# Patient Record
Sex: Female | Born: 1945 | Race: White | Hispanic: No | Marital: Married | Smoking: Former smoker
Health system: Southern US, Community
[De-identification: ages and names within clinical notes are randomized; demographics above are authoritative.]

## PROBLEM LIST (undated history)

## (undated) DIAGNOSIS — E785 Hyperlipidemia, unspecified: Secondary | ICD-10-CM

## (undated) DIAGNOSIS — M4800 Spinal stenosis, site unspecified: Secondary | ICD-10-CM

## (undated) DIAGNOSIS — M797 Fibromyalgia: Secondary | ICD-10-CM

## (undated) DIAGNOSIS — M810 Age-related osteoporosis without current pathological fracture: Secondary | ICD-10-CM

## (undated) DIAGNOSIS — H40813 Glaucoma with increased episcleral venous pressure, bilateral: Secondary | ICD-10-CM

## (undated) HISTORY — PX: CATARACT EXTRACTION: SUR2

## (undated) HISTORY — PX: CARPAL TUNNEL RELEASE: SHX101

## (undated) HISTORY — DX: Glaucoma with increased episcleral venous pressure, bilateral: H40.813

## (undated) HISTORY — DX: Fibromyalgia: M79.7

## (undated) HISTORY — PX: RHINOPLASTY: SUR1284

## (undated) HISTORY — PX: TUBAL LIGATION: SHX77

## (undated) HISTORY — PX: COLONOSCOPY: SHX174

## (undated) HISTORY — DX: Age-related osteoporosis without current pathological fracture: M81.0

## (undated) HISTORY — DX: Hyperlipidemia, unspecified: E78.5

---

## 2001-06-09 ENCOUNTER — Other Ambulatory Visit: Admission: RE | Admit: 2001-06-09 | Discharge: 2001-06-09 | Payer: Self-pay

## 2001-12-15 ENCOUNTER — Ambulatory Visit (HOSPITAL_BASED_OUTPATIENT_CLINIC_OR_DEPARTMENT_OTHER): Admission: RE | Admit: 2001-12-15 | Discharge: 2001-12-15 | Payer: Self-pay | Admitting: Orthopedic Surgery

## 2006-04-11 ENCOUNTER — Ambulatory Visit: Payer: Self-pay | Admitting: Internal Medicine

## 2006-04-23 ENCOUNTER — Ambulatory Visit: Payer: Self-pay | Admitting: Internal Medicine

## 2010-09-04 ENCOUNTER — Encounter (INDEPENDENT_AMBULATORY_CARE_PROVIDER_SITE_OTHER): Payer: Medicare Other

## 2010-09-04 DIAGNOSIS — M79609 Pain in unspecified limb: Secondary | ICD-10-CM

## 2010-09-07 NOTE — Procedures (Unsigned)
DUPLEX DEEP VENOUS EXAM - LOWER EXTREMITY  INDICATION:  Pain and swelling.  HISTORY:  Edema:  Yes. Trauma/Surgery:  No. Pain:  Yes. PE:  No. Previous DVT:  No. Anticoagulants:  No. Other:  DUPLEX EXAM:               CFV   SFV   PopV  PTV    GSV               R  L  R  L  R  L  R   L  R  L Thrombosis    o  o  o     o     o      o Spontaneous   +  +  +     +     +      + Phasic        +  +  +     +     +      + Augmentation  +  +  +     +     +      + Compressible  +  +  +     +     +      + Competent     +  +  +     +     +      +  Legend:  + - yes  o - no  p - partial  D - decreased  IMPRESSION:  No evidence of acute deep vein thrombosis or superficial thrombophlebitis within the right lower extremity or venous insufficiency.  Called and spoke with Drema Halon at 10:01.   _____________________________ Janetta Hora. Fields, MD  OD/MEDQ  D:  09/04/2010  T:  09/04/2010  Job:  [id number]

## 2011-04-03 ENCOUNTER — Encounter: Payer: Self-pay | Admitting: Internal Medicine

## 2012-01-02 ENCOUNTER — Encounter: Payer: Self-pay | Admitting: Internal Medicine

## 2012-04-07 DIAGNOSIS — H11829 Conjunctivochalasis, unspecified eye: Secondary | ICD-10-CM | POA: Insufficient documentation

## 2012-04-07 DIAGNOSIS — H43399 Other vitreous opacities, unspecified eye: Secondary | ICD-10-CM | POA: Insufficient documentation

## 2012-04-07 DIAGNOSIS — H35379 Puckering of macula, unspecified eye: Secondary | ICD-10-CM | POA: Insufficient documentation

## 2012-04-07 DIAGNOSIS — H52209 Unspecified astigmatism, unspecified eye: Secondary | ICD-10-CM | POA: Insufficient documentation

## 2012-04-07 DIAGNOSIS — H02889 Meibomian gland dysfunction of unspecified eye, unspecified eyelid: Secondary | ICD-10-CM | POA: Insufficient documentation

## 2012-04-07 DIAGNOSIS — H5210 Myopia, unspecified eye: Secondary | ICD-10-CM | POA: Insufficient documentation

## 2012-04-07 DIAGNOSIS — H02839 Dermatochalasis of unspecified eye, unspecified eyelid: Secondary | ICD-10-CM | POA: Insufficient documentation

## 2012-04-07 DIAGNOSIS — H04123 Dry eye syndrome of bilateral lacrimal glands: Secondary | ICD-10-CM | POA: Insufficient documentation

## 2012-04-07 DIAGNOSIS — H40059 Ocular hypertension, unspecified eye: Secondary | ICD-10-CM | POA: Insufficient documentation

## 2012-04-07 DIAGNOSIS — H43819 Vitreous degeneration, unspecified eye: Secondary | ICD-10-CM | POA: Insufficient documentation

## 2012-06-24 ENCOUNTER — Other Ambulatory Visit: Payer: Self-pay | Admitting: Dermatology

## 2013-02-18 ENCOUNTER — Other Ambulatory Visit: Payer: Self-pay | Admitting: Unknown Physician Specialty

## 2013-02-18 DIAGNOSIS — E2839 Other primary ovarian failure: Secondary | ICD-10-CM

## 2013-03-26 ENCOUNTER — Other Ambulatory Visit: Payer: Medicare Other

## 2013-08-02 DIAGNOSIS — H40003 Preglaucoma, unspecified, bilateral: Secondary | ICD-10-CM | POA: Insufficient documentation

## 2014-02-01 ENCOUNTER — Encounter: Payer: Self-pay | Admitting: Internal Medicine

## 2014-03-23 ENCOUNTER — Encounter: Payer: Self-pay | Admitting: Internal Medicine

## 2014-04-12 ENCOUNTER — Encounter: Payer: Medicare Other | Admitting: Internal Medicine

## 2014-05-04 ENCOUNTER — Ambulatory Visit (AMBULATORY_SURGERY_CENTER): Payer: Self-pay

## 2014-05-04 VITALS — Ht 67.0 in | Wt 161.0 lb

## 2014-05-04 DIAGNOSIS — Z860100 Personal history of colon polyps, unspecified: Secondary | ICD-10-CM

## 2014-05-04 MED ORDER — MOVIPREP 100 G PO SOLR: 1.0000 | Freq: Once | ORAL | Status: DC

## 2014-05-04 NOTE — Progress Notes (Signed)
No allergies to eggs or soy No home oxygen No diet/weight loss meds No past problems with anesthesia  Has email  Emmi instructions given for colonoscopy 

## 2014-05-18 ENCOUNTER — Encounter: Payer: Self-pay | Admitting: Internal Medicine

## 2014-05-18 ENCOUNTER — Ambulatory Visit (AMBULATORY_SURGERY_CENTER): Payer: Medicare Other | Admitting: Internal Medicine

## 2014-05-18 VITALS — BP 136/92 | HR 79 | Temp 98.3°F | Resp 21 | Ht 67.0 in | Wt 161.0 lb

## 2014-05-18 DIAGNOSIS — R194 Change in bowel habit: Secondary | ICD-10-CM

## 2014-05-18 DIAGNOSIS — K599 Functional intestinal disorder, unspecified: Secondary | ICD-10-CM | POA: Diagnosis not present

## 2014-05-18 DIAGNOSIS — R197 Diarrhea, unspecified: Secondary | ICD-10-CM

## 2014-05-18 DIAGNOSIS — Z86010 Personal history of colonic polyps: Secondary | ICD-10-CM

## 2014-05-18 MED ORDER — SODIUM CHLORIDE 0.9 % IV SOLN: 500.0000 mL | INTRAVENOUS | Status: DC

## 2014-05-18 NOTE — Progress Notes (Signed)
Report to PACU, RN, vss, BBS= Clear.  

## 2014-05-18 NOTE — Progress Notes (Signed)
Called to room to assist during endoscopic procedure.  Patient ID and intended procedure confirmed with present staff. Received instructions for my participation in the procedure from the performing physician.  

## 2014-05-18 NOTE — Op Note (Signed)
[facility] [address] [place], [id number]   COLONOSCOPY PROCEDURE REPORT  PATIENT: Autumn ScarletRead, [name]  MR#: [id number] BIRTHDATE: 05-Laakso-1947 , 69  yrs. old GENDER: female ENDOSCOPIST: Roxy CedarJohn N Perry Jr, MD REFERRED BY:W.  Buren Kosouglas Shaw, M.D. PROCEDURE DATE:  05/18/2014 PROCEDURE:   Colonoscopy with biopsy First Screening Colonoscopy - Avg.  risk and is 50 yrs.  old or older - No.  Prior Negative Screening - Now for repeat screening. N/A  History of Adenoma - Now for follow-up colonoscopy & has been > or = to 3 yrs.  N/A  Polyps Removed Today? No.  Polyps Removed Today? No.  Recommend repeat exam, <10 yrs? No. ASA CLASS:   Class II INDICATIONS:change in bowel habits (loose stools) and follow up of colonic polyps(large right-sided hyperplastic polyp February 2008).  MEDICATIONS: Monitored anesthesia care and Propofol 230 mg IV  DESCRIPTION OF PROCEDURE:   After the risks benefits and alternatives of the procedure were thoroughly explained, informed consent was obtained.  The digital rectal exam revealed no abnormalities of the rectum.   The LB [id number]  endoscope was introduced through the anus and advanced to the cecum, which was identified by both the appendix and ileocecal valve. No adverse events experienced.   The quality of the prep was excellent, using MoviPrep  The instrument was then slowly withdrawn as the colon was fully examined.    COLON FINDINGS: There was mild diverticulosis noted in the sigmoid colon.   The examination was otherwise normal. Random colon biopsies taken.  Retroflexed views revealed no abnormalities. The time to cecum=3 minutes 39 seconds.  Withdrawal time=10 minutes 32 seconds.  The scope was withdrawn and the procedure completed. COMPLICATIONS: There were no immediate complications.  ENDOSCOPIC IMPRESSION: 1.   Mild diverticulosis was noted in the sigmoid colon 2.   The examination was otherwise normal 3.  Change in bowel  habits. Random colon biopsies taken  RECOMMENDATIONS: 1.  Await biopsy results. Dr. Marina GoodellPerry will send you a letter in a few weeks. 2.  Return to the care of your primary provider.  GI follow up as needed  eSigned:  Roxy CedarJohn N Perry Jr, MD 05/18/2014 10:02 AM   cc: Kari BaarsW.  Douglas Shaw, MD and The Patient

## 2014-05-18 NOTE — Patient Instructions (Signed)
YOU HAD AN ENDOSCOPIC PROCEDURE TODAY AT THE [facility]:   Refer to the procedure report that was given to you for any specific questions about what was found during the examination.  If the procedure report does not answer your questions, please call your gastroenterologist to clarify.  If you requested that your care partner not be given the details of your procedure findings, then the procedure report has been included in a sealed envelope for you to review at your convenience later.  YOU SHOULD EXPECT: Some feelings of bloating in the abdomen. Passage of more gas than usual.  Walking can help get rid of the air that was put into your GI tract during the procedure and reduce the bloating. If you had a lower endoscopy (such as a colonoscopy or flexible sigmoidoscopy) you Doxtater notice spotting of blood in your stool or on the toilet paper. If you underwent a bowel prep for your procedure, you Harkins not have a normal bowel movement for a few days.  Please Note:  You might notice some irritation and congestion in your nose or some drainage.  This is from the oxygen used during your procedure.  There is no need for concern and it should clear up in a day or so.  SYMPTOMS TO REPORT IMMEDIATELY:   Following lower endoscopy (colonoscopy or flexible sigmoidoscopy):  Excessive amounts of blood in the stool  Significant tenderness or worsening of abdominal pains  Swelling of the abdomen that is new, acute  Fever of 100F or higher   A gastroenterologist can be reached at any hour by calling [phone].   DIET: Your first meal following the procedure should be a small meal and then it is ok to progress to your normal diet. Heavy or fried foods are harder to digest and [place] make you feel nauseous or bloated.  Likewise, meals heavy in dairy and vegetables can increase bloating.  Drink plenty of fluids but you should avoid alcoholic beverages for 24 hours.  ACTIVITY:  You should plan to take it  easy for the rest of today and you should NOT DRIVE or use heavy machinery until tomorrow (because of the sedation medicines used during the test).    FOLLOW UP: Our staff will call the number listed on your records the next business day following your procedure to check on you and address any questions or concerns that you Pless have regarding the information given to you following your procedure. If we do not reach you, we will leave a message.  However, if you are feeling well and you are not experiencing any problems, there is no need to return our call.  We will assume that you have returned to your regular daily activities without incident.  If any biopsies were taken you will be contacted by phone or by letter within the next 1-3 weeks.  Please call us at [phone] if you have not heard about the biopsies in 3 weeks.    SIGNATURES/CONFIDENTIALITY: You and/or your care partner have signed paperwork which will be entered into your electronic medical record.  These signatures attest to the fact that that the information above on your After Visit Summary has been reviewed and is understood.  Full responsibility of the confidentiality of this discharge information lies with you and/or your care-partner.  Recommendations Discharge instructions given to patient and/or care partner. Diverticulosis and high fiber diet handouts provided. Biopsy results pending.

## 2014-05-19 ENCOUNTER — Telehealth: Payer: Self-pay | Admitting: *Deleted

## 2014-05-19 NOTE — Telephone Encounter (Signed)
Left message that we called for f/u 

## 2014-05-24 ENCOUNTER — Encounter: Payer: Self-pay | Admitting: Internal Medicine

## 2014-07-26 DIAGNOSIS — H26492 Other secondary cataract, left eye: Secondary | ICD-10-CM | POA: Insufficient documentation

## 2019-05-09 ENCOUNTER — Ambulatory Visit: Payer: Medicare Other | Attending: Internal Medicine

## 2019-05-09 DIAGNOSIS — Z23 Encounter for immunization: Secondary | ICD-10-CM

## 2019-05-09 NOTE — Progress Notes (Signed)
   Covid-19 Vaccination Clinic  Name:  Autumn Paul    MRN: [id number] DOB: 1945-12-01  05/09/2019  [name] was observed post Covid-19 immunization for 15 minutes without incidence. She was provided with Vaccine Information Sheet and instruction to access the V-Safe system.   [name] was instructed to call 911 with any severe reactions post vaccine: Marland Kitchen Difficulty breathing  . Swelling of your face and throat  . A fast heartbeat  . A bad rash all over your body  . Dizziness and weakness    Immunizations Administered    Name Date Dose VIS Date Route   Pfizer COVID-19 Vaccine 05/09/2019  9:14 AM 0.3 mL 02/26/2019 Intramuscular   Manufacturer: [employer], [employer]   Lot: [id number]   NDC: [address]-5

## 2019-06-02 ENCOUNTER — Ambulatory Visit: Payer: Medicare Other | Attending: Internal Medicine

## 2019-06-02 DIAGNOSIS — Z23 Encounter for immunization: Secondary | ICD-10-CM

## 2019-06-02 NOTE — Progress Notes (Signed)
   Covid-19 Vaccination Clinic  Name:  Autumn Paul    MRN: [id number] DOB: 1945/08/28  06/02/2019  Autumn Paul was observed post Covid-19 immunization for 15 minutes without incident. She was provided with Vaccine Information Sheet and instruction to access the V-Safe system.   Autumn Paul was instructed to call 911 with any severe reactions post vaccine: Marland Kitchen Difficulty breathing  . Swelling of face and throat  . A fast heartbeat  . A bad rash all over body  . Dizziness and weakness   Immunizations Administered    Name Date Dose VIS Date Route   Pfizer COVID-19 Vaccine 06/02/2019  8:29 AM 0.3 mL 02/26/2019 Intramuscular   Manufacturer: [employer], [employer]   Lot: [id number]   NDC: [address]-7

## 2019-07-06 ENCOUNTER — Other Ambulatory Visit: Payer: Self-pay | Admitting: Orthopedic Surgery

## 2019-07-06 DIAGNOSIS — M545 Low back pain, unspecified: Secondary | ICD-10-CM

## 2019-08-10 ENCOUNTER — Ambulatory Visit
Admission: RE | Admit: 2019-08-10 | Discharge: 2019-08-10 | Disposition: A | Discharge status: Home / Self Care | Payer: Medicare Other | Source: Ambulatory Visit | Attending: Orthopedic Surgery | Admitting: Orthopedic Surgery

## 2019-08-10 DIAGNOSIS — M545 Low back pain, unspecified: Secondary | ICD-10-CM

## 2019-10-19 ENCOUNTER — Ambulatory Visit (INDEPENDENT_AMBULATORY_CARE_PROVIDER_SITE_OTHER): Payer: Medicare Other

## 2019-10-19 ENCOUNTER — Ambulatory Visit (INDEPENDENT_AMBULATORY_CARE_PROVIDER_SITE_OTHER): Payer: Medicare Other | Admitting: Podiatry

## 2019-10-19 ENCOUNTER — Encounter: Payer: Self-pay | Admitting: Podiatry

## 2019-10-19 ENCOUNTER — Other Ambulatory Visit: Payer: Self-pay

## 2019-10-19 DIAGNOSIS — M722 Plantar fascial fibromatosis: Secondary | ICD-10-CM

## 2019-10-19 MED ORDER — METHYLPREDNISOLONE 4 MG PO TBPK
ORAL_TABLET | ORAL | 0 refills | Status: DC
Start: 2019-10-19 — End: 2019-10-19

## 2019-10-19 MED ORDER — METHYLPREDNISOLONE 4 MG PO TBPK
ORAL_TABLET | ORAL | 0 refills | Status: DC
Start: 2019-10-19 — End: 2020-12-20

## 2019-10-19 NOTE — Progress Notes (Signed)
Subjective:  Patient ID: Autumn Paul, female    DOB: 10-28-1945,  MRN: [id number] HPI Chief Complaint  Patient presents with  . Foot Pain    Plantar heel left - aching x 6 weeks, AM pain, on celebrex for arthritis  . Foot Pain    1st MPJ right - injury of jamming toe years ago, developed arthritis, doesn't want surgery  . New Patient (Initial Visit)    74 y.o. female presents with the above complaint.   ROS: Denies fever chills nausea vomiting muscle aches pains calf pain back pain chest pain shortness of breath.  Past Medical History:  Diagnosis Date  . 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 Hyperlipidemia    Past Surgical History:  Procedure Laterality Date  . CARPAL TUNNEL RELEASE     right wrist  . CATARACT EXTRACTION    . COLONOSCOPY    . RHINOPLASTY    . TUBAL LIGATION      Current Outpatient Medications:  .  Ascorbic Acid (VITAMIN C PO), Take by mouth., Disp: , Rfl:  .  celecoxib (CELEBREX) 200 MG capsule, Take 200 mg by mouth 2 (two) times daily., Disp: , Rfl:  .  diclofenac Sodium (VOLTAREN) 1 % GEL, Apply topically., Disp: , Rfl:  .  dorzolamide-timolol (COSOPT) 22.3-6.8 MG/ML ophthalmic solution, 1 drop 2 (two) times daily., Disp: , Rfl:  .  Ergocalciferol (VITAMIN D2 PO), Take 1.25 mg by mouth once a week., Disp: , Rfl:  .  esomeprazole (NEXIUM) 20 MG capsule, Take 20 mg by mouth daily at 12 noon., Disp: , Rfl:  .  ezetimibe (ZETIA) 10 MG tablet, Take 10 mg by mouth daily., Disp: , Rfl:  .  FLUTICASONE PROPIONATE, NASAL, NA, Place into the nose at bedtime., Disp: , Rfl:  .  latanoprost (XALATAN) 0.005 % ophthalmic solution, 1 drop at bedtime., Disp: , Rfl:  .  methylPREDNISolone (MEDROL DOSEPAK) 4 MG TBPK tablet, 6 day dose pack - take as directed, Disp: 21 tablet, Rfl: 0 .  rosuvastatin (CRESTOR) 10 MG tablet, Take 10 mg by mouth at bedtime., Disp: , Rfl:  .  vitamin B-12 (CYANOCOBALAMIN) 500 MCG tablet, Take  500 mcg by mouth daily., Disp: , Rfl:   Allergies  Allergen Reactions  . Aleve [Naproxen Sodium] Itching    Pupils dilate  . Sulfa Antibiotics     rash   Review of Systems Objective:  There were no vitals filed for this visit.  General: Well developed, nourished, in no acute distress, alert and oriented x3   Dermatological: Skin is warm, dry and supple bilateral. Nails x 10 are well maintained; remaining integument appears unremarkable at this time. There are no open sores, no preulcerative lesions, no rash or signs of infection present.  Vascular: Dorsalis Pedis artery and Posterior Tibial artery pedal pulses are 2/4 bilateral with immedate capillary fill time. Pedal hair growth present. No varicosities and no lower extremity edema present bilateral.   Neruologic: Grossly intact via light touch bilateral. Vibratory intact via tuning fork bilateral. Protective threshold with Semmes Wienstein monofilament intact to all pedal sites bilateral. Patellar and Achilles deep tendon reflexes 2+ bilateral. No Babinski or clonus noted bilateral.   Musculoskeletal: No gross boney pedal deformities bilateral. No pain, crepitus, or limitation noted with foot and ankle range of motion bilateral. Muscular strength 5/5 in all groups tested bilateral.  Pain on palpation medial calcaneal tubercle of the left heel.  Right foot  demonstrates limited range of motion of the first metatarsophalangeal joint right.  No crepitation noted.  Gait: Unassisted, Nonantalgic.    Radiographs:  Radiographs taken today of the left heel demonstrate soft tissue increase in density plantar fascial kidney insertion site no fractures are identified.  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  Plan fasciitis left foot.  Plan: At this point I started her on methylprednisolone she will discontinue Celebrex during that time and then restarted once done with the prednisone.  I  also injected her left heel today 20 mg Kenalog 5 mg Marcaine point maximal tenderness.  Tolerated procedure well without complications she understands that she is to notify me with any concerns.  Also placed her in a plantar fascial brace discussed appropriate shoe gear stretching exercises ice therapy and shoe gear modification.  We will follow-up with her in 1 month     Lyman Balingit T. [place], [place]

## 2019-10-19 NOTE — Patient Instructions (Signed)
Plantar Fasciitis (Heel Spur Syndrome) with Rehab The plantar fascia is a fibrous, ligament-like, soft-tissue structure that spans the bottom of the foot. Plantar fasciitis is a condition that causes pain in the foot due to inflammation of the tissue. SYMPTOMS   Pain and tenderness on the underneath side of the foot.  Pain that worsens with standing or walking. CAUSES  Plantar fasciitis is caused by irritation and injury to the plantar fascia on the underneath side of the foot. Common mechanisms of injury include:  Direct trauma to bottom of the foot.  Damage to a small nerve that runs under the foot where the main fascia attaches to the heel bone.  Stress placed on the plantar fascia due to bone spurs. RISK INCREASES WITH:   Activities that place stress on the plantar fascia (running, jumping, pivoting, or cutting).  Poor strength and flexibility.  Improperly fitted shoes.  Tight calf muscles.  Flat feet.  Failure to warm-up properly before activity.  Obesity. PREVENTION  Warm up and stretch properly before activity.  Allow for adequate recovery between workouts.  Maintain physical fitness:  Strength, flexibility, and endurance.  Cardiovascular fitness.  Maintain a health body weight.  Avoid stress on the plantar fascia.  Wear properly fitted shoes, including arch supports for individuals who have flat feet.  PROGNOSIS  If treated properly, then the symptoms of plantar fasciitis usually resolve without surgery. However, occasionally surgery is necessary.  RELATED COMPLICATIONS   Recurrent symptoms that [name] result in a chronic condition.  Problems of the lower back that are caused by compensating for the injury, such as limping.  Pain or weakness of the foot during push-off following surgery.  Chronic inflammation, scarring, and partial or complete fascia tear, occurring more often from repeated injections.  TREATMENT  Treatment initially involves the  use of ice and medication to help reduce pain and inflammation. The use of strengthening and stretching exercises [name] help reduce pain with activity, especially stretches of the Achilles tendon. These exercises [name] be performed at home or with a therapist. Your caregiver [name] recommend that you use heel cups of arch supports to help reduce stress on the plantar fascia. Occasionally, corticosteroid injections are given to reduce inflammation. If symptoms persist for greater than 6 months despite non-surgical (conservative), then surgery [name] be recommended.   MEDICATION   If pain medication is necessary, then nonsteroidal anti-inflammatory medications, such as aspirin and ibuprofen, or other minor pain relievers, such as acetaminophen, are often recommended.  Do not take pain medication within 7 days before surgery.  Prescription pain relievers [name] be given if deemed necessary by your caregiver. Use only as directed and only as much as you need.  Corticosteroid injections [name] be given by your caregiver. These injections should be reserved for the most serious cases, because they [name] only be given a certain number of times.  HEAT AND COLD  Cold treatment (icing) relieves pain and reduces inflammation. Cold treatment should be applied for 10 to 15 minutes every 2 to 3 hours for inflammation and pain and immediately after any activity that aggravates your symptoms. Use ice packs or massage the area with a piece of ice (ice massage).  Heat treatment [name] be used prior to performing the stretching and strengthening activities prescribed by your caregiver, physical therapist, or athletic trainer. Use a heat pack or soak the injury in warm water.  SEEK IMMEDIATE MEDICAL CARE IF:  Treatment seems to offer no benefit, or the condition worsens.  Any medications   produce adverse side effects.  EXERCISES- RANGE OF MOTION (ROM) AND STRETCHING EXERCISES - Plantar Fasciitis (Heel Spur Syndrome) These exercises  [name] help you when beginning to rehabilitate your injury. Your symptoms [name] resolve with or without further involvement from your physician, physical therapist or athletic trainer. While completing these exercises, remember:   Restoring tissue flexibility helps normal motion to return to the joints. This allows healthier, less painful movement and activity.  An effective stretch should be held for at least 30 seconds.  A stretch should never be painful. You should only feel a gentle lengthening or release in the stretched tissue.  RANGE OF MOTION - Toe Extension, Flexion  Sit with your right / left leg crossed over your opposite knee.  Grasp your toes and gently pull them back toward the top of your foot. You should feel a stretch on the bottom of your toes and/or foot.  Hold this stretch for 10 seconds.  Now, gently pull your toes toward the bottom of your foot. You should feel a stretch on the top of your toes and or foot.  Hold this stretch for 10 seconds. Repeat  times. Complete this stretch 3 times per day.   RANGE OF MOTION - Ankle Dorsiflexion, Active Assisted  Remove shoes and sit on a chair that is preferably not on a carpeted surface.  Place right / left foot under knee. Extend your opposite leg for support.  Keeping your heel down, slide your right / left foot back toward the chair until you feel a stretch at your ankle or calf. If you do not feel a stretch, slide your bottom forward to the edge of the chair, while still keeping your heel down.  Hold this stretch for 10 seconds. Repeat 3 times. Complete this stretch 2 times per day.   STRETCH  Gastroc, Standing  Place hands on wall.  Extend right / left leg, keeping the front knee somewhat bent.  Slightly point your toes inward on your back foot.  Keeping your right / left heel on the floor and your knee straight, shift your weight toward the wall, not allowing your back to arch.  You should feel a gentle stretch  in the right / left calf. Hold this position for 10 seconds. Repeat 3 times. Complete this stretch 2 times per day.  STRETCH  Soleus, Standing  Place hands on wall.  Extend right / left leg, keeping the other knee somewhat bent.  Slightly point your toes inward on your back foot.  Keep your right / left heel on the floor, bend your back knee, and slightly shift your weight over the back leg so that you feel a gentle stretch deep in your back calf.  Hold this position for 10 seconds. Repeat 3 times. Complete this stretch 2 times per day.  STRETCH  Gastrocsoleus, Standing  Note: This exercise can place a lot of stress on your foot and ankle. Please complete this exercise only if specifically instructed by your caregiver.   Place the ball of your right / left foot on a step, keeping your other foot firmly on the same step.  Hold on to the wall or a rail for balance.  Slowly lift your other foot, allowing your body weight to press your heel down over the edge of the step.  You should feel a stretch in your right / left calf.  Hold this position for 10 seconds.  Repeat this exercise with a slight bend in your right /   left knee. Repeat 3 times. Complete this stretch 2 times per day.   STRENGTHENING EXERCISES - Plantar Fasciitis (Heel Spur Syndrome)  These exercises Ryther help you when beginning to rehabilitate your injury. They Wos resolve your symptoms with or without further involvement from your physician, physical therapist or athletic trainer. While completing these exercises, remember:   Muscles can gain both the endurance and the strength needed for everyday activities through controlled exercises.  Complete these exercises as instructed by your physician, physical therapist or athletic trainer. Progress the resistance and repetitions only as guided.  STRENGTH - Towel Curls  Sit in a chair positioned on a non-carpeted surface.  Place your foot on a towel, keeping your heel  on the floor.  Pull the towel toward your heel by only curling your toes. Keep your heel on the floor. Repeat 3 times. Complete this exercise 2 times per day.  STRENGTH - Ankle Inversion  Secure one end of a rubber exercise band/tubing to a fixed object (table, pole). Loop the other end around your foot just before your toes.  Place your fists between your knees. This will focus your strengthening at your ankle.  Slowly, pull your big toe up and in, making sure the band/tubing is positioned to resist the entire motion.  Hold this position for 10 seconds.  Have your muscles resist the band/tubing as it slowly pulls your foot back to the starting position. Repeat 3 times. Complete this exercises 2 times per day.  Document Released: 03/04/2005 Document Revised: 05/27/2011 Document Reviewed: 06/16/2008 Exit[employer] Patient Information 2014 [employer].  

## 2019-11-23 ENCOUNTER — Ambulatory Visit (INDEPENDENT_AMBULATORY_CARE_PROVIDER_SITE_OTHER): Payer: Medicare Other | Admitting: Podiatry

## 2019-11-23 ENCOUNTER — Other Ambulatory Visit: Payer: Self-pay

## 2019-11-23 DIAGNOSIS — M722 Plantar fascial fibromatosis: Secondary | ICD-10-CM | POA: Diagnosis not present

## 2019-11-24 NOTE — Progress Notes (Signed)
  Subjective:  Patient ID: Autumn Paul, female    DOB: [birth year],  MRN: [id number]  Chief Complaint  Patient presents with  . Plantar Fasciitis     Plantar fasciitis, left foot    74 y.o. female presents with the above complaint. History confirmed with patient.  Overall she is improved and is not nearly as painful as it was.  The injection helped a great deal.  She has took a Medrol Dosepak which helped and she has restarted her Celebrex.  Objective:  Physical Exam: warm, good capillary refill, no trophic changes or ulcerative lesions, normal DP and PT pulses and normal sensory exam. Left Foot: Mild tenderness palpation to medial calcaneal tubercle  Assessment:   1. Plantar fasciitis      Plan:  Patient was evaluated and treated and all questions answered.   -Overall she is improving significantly.  Recommend she continue stretching, icing as needed, and wearing a plantar fascial brace.  She will see Dr. Al Corpus again in 1 month for follow-up and further injection if necessary at that time  Return in about 1 month (around 12/23/2019) for with Dr Al Corpus.

## 2019-12-21 ENCOUNTER — Ambulatory Visit (INDEPENDENT_AMBULATORY_CARE_PROVIDER_SITE_OTHER): Payer: Medicare Other | Admitting: Podiatry

## 2019-12-21 ENCOUNTER — Encounter: Payer: Self-pay | Admitting: Podiatry

## 2019-12-21 ENCOUNTER — Other Ambulatory Visit: Payer: Self-pay

## 2019-12-21 DIAGNOSIS — M722 Plantar fascial fibromatosis: Secondary | ICD-10-CM

## 2019-12-21 NOTE — Progress Notes (Signed)
She presents today for follow-up of her plantar fasciitis states that is still better than it was before but it had gotten better now starting to hurt again is refer to the plantar fasciitis left.  Objective: Vital signs are stable alert oriented x3.  Pulses are palpable.  She has pain on palpation medial calcaneal tubercle of the left heel.  Assessment: Plantar fasciitis left.  Plan: Injected the left heel once again today she will continue all other conservative therapies follow-up with me in 1 month if necessary.

## 2020-03-28 ENCOUNTER — Other Ambulatory Visit: Payer: Medicare Other

## 2020-07-03 IMAGING — MR MR LUMBAR SPINE W/O CM
4 of 5 series · 18 of 48 positions shown · non-contrast
Comparison: None.

CLINICAL DATA: Low back pain with hip and gluteal pain. Bilateral
posterior knee pain.

EXAM:
MRI LUMBAR SPINE WITHOUT CONTRAST
TECHNIQUE: Multiplanar, multisequence MR imaging of the lumbar spine was
performed. No intravenous contrast was administered.

[Series 6: T2 · sagittal · 4.0mm · 0.73mm/px · 6 of 15 slices shown (1 of 2)]
[im 1/15]
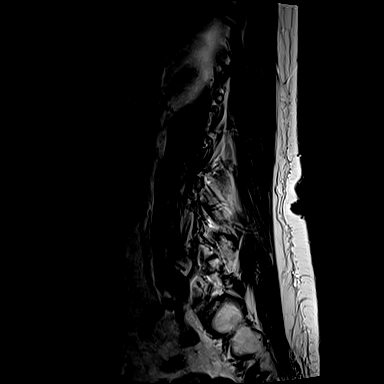
[im 3/15]
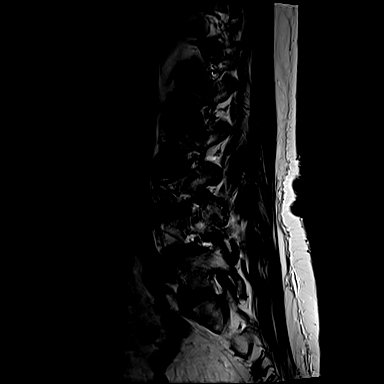
[im 6/15]
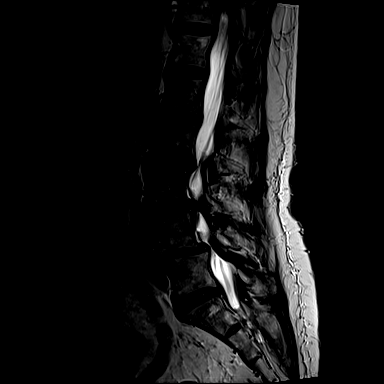
[im 9/15]
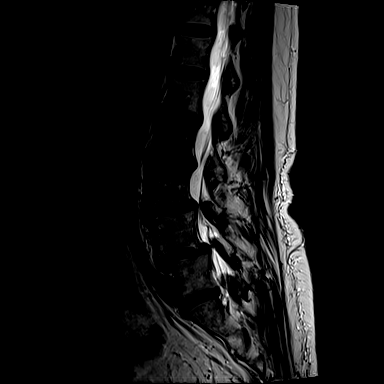
[im 12/15]
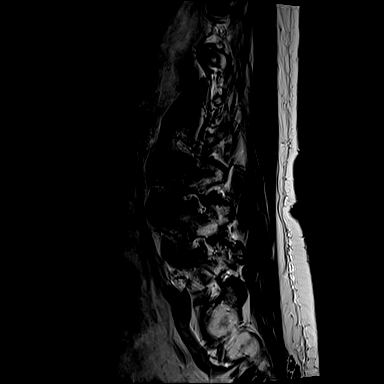
[im 15/15]
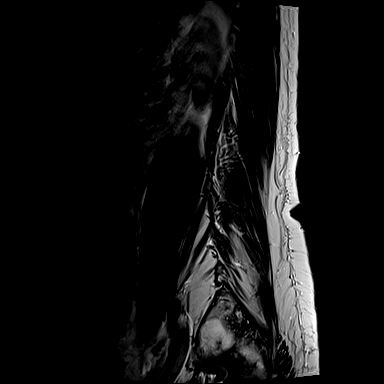

[Series 7: T1 · sagittal · 4.0mm · 0.73mm/px · 3 of 15 slices shown (1 of 2)]
[im 3/15]
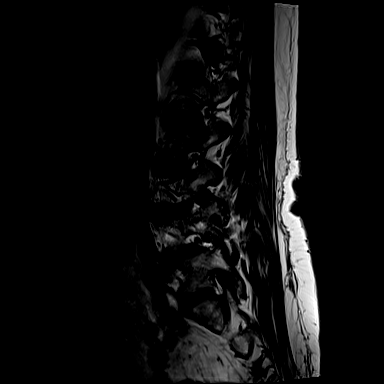
[im 9/15]
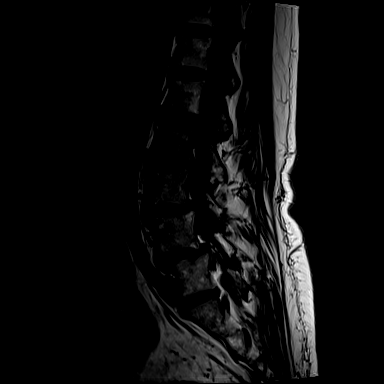
[im 15/15]
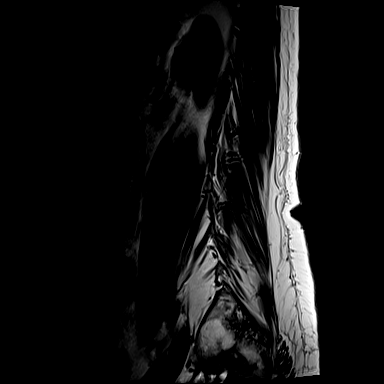

[Series 11: T1 · axial · 4.0mm · 0.28mm/px · z∈[+16,+175]mm · 3 of 39 slices shown (2 of 2)]
[im 6/39]
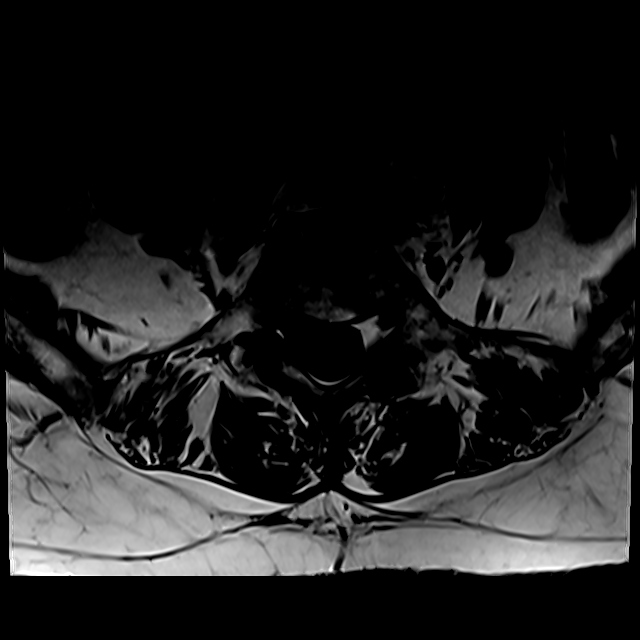
[im 20/39]
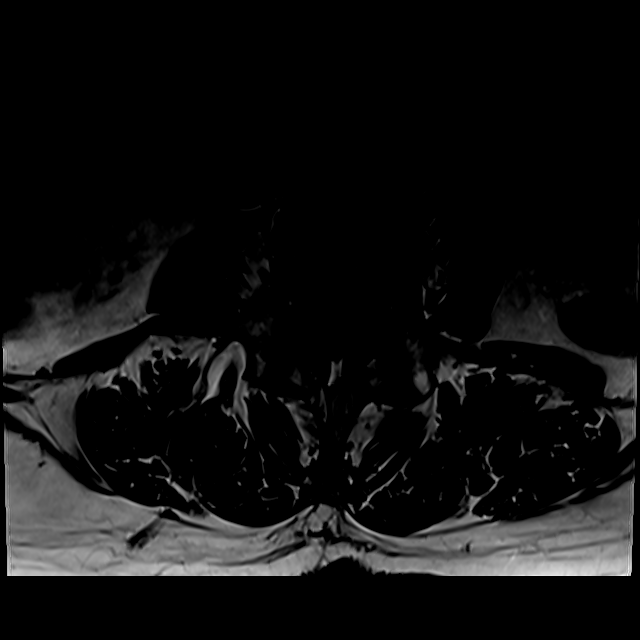
[im 33/39]
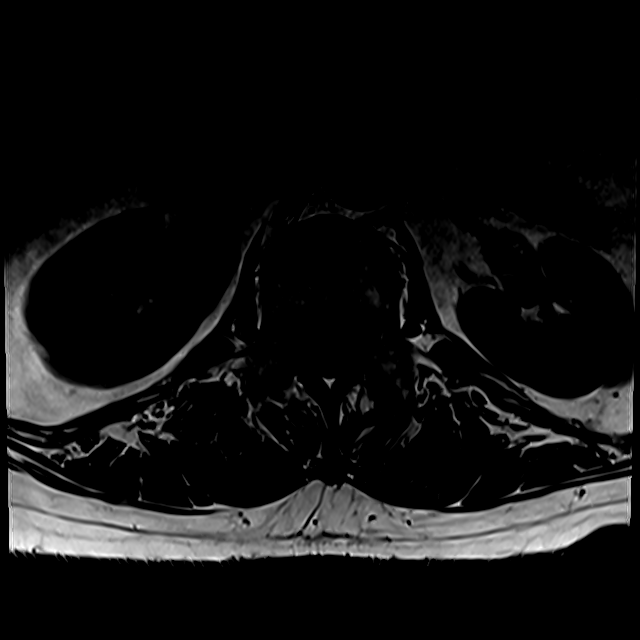

[Series 14: T2 · axial · 4.0mm · 0.28mm/px · z∈[-9,+175]mm · 6 of 39 slices shown (2 of 2)]
[im 1/39]
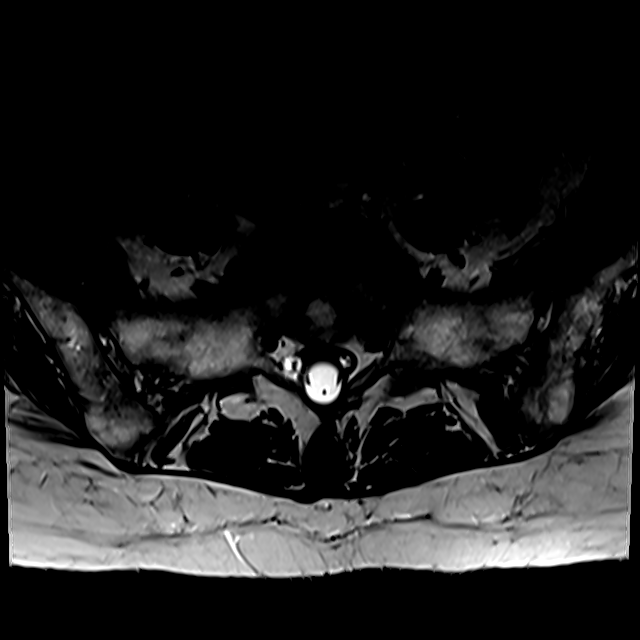
[im 6/39]
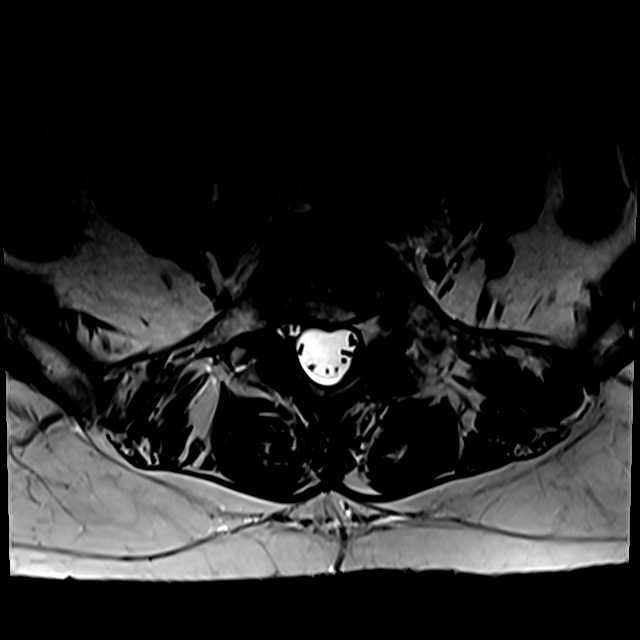
[im 11/39]
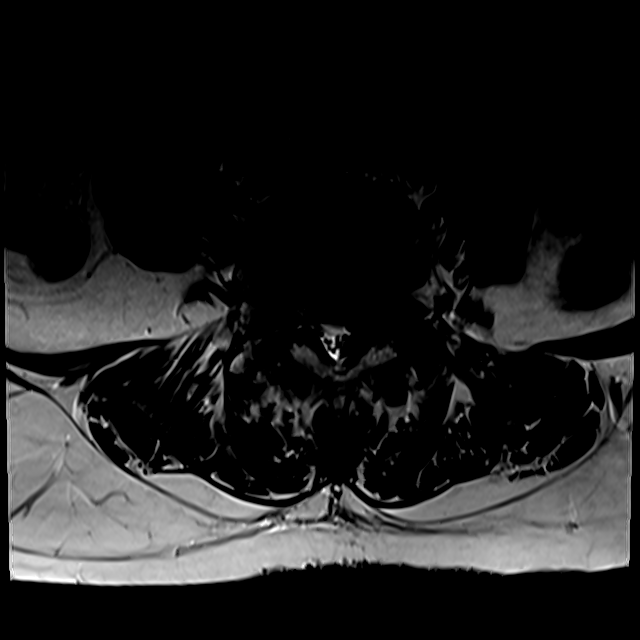
[im 17/39]
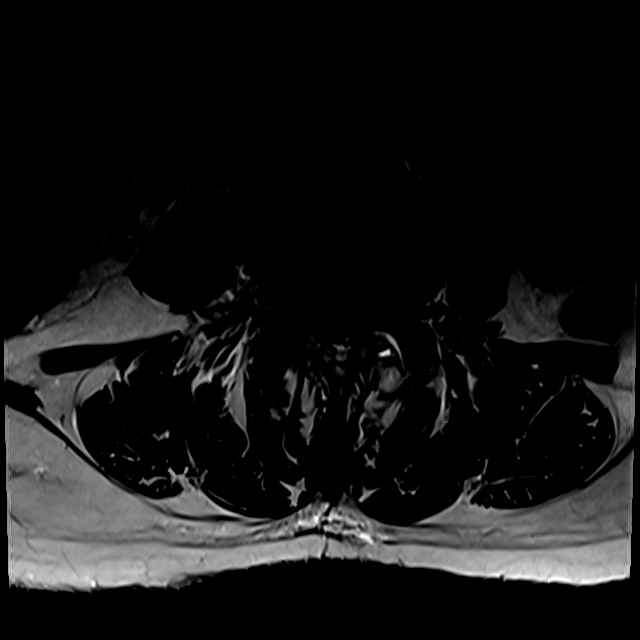
[im 20/39]
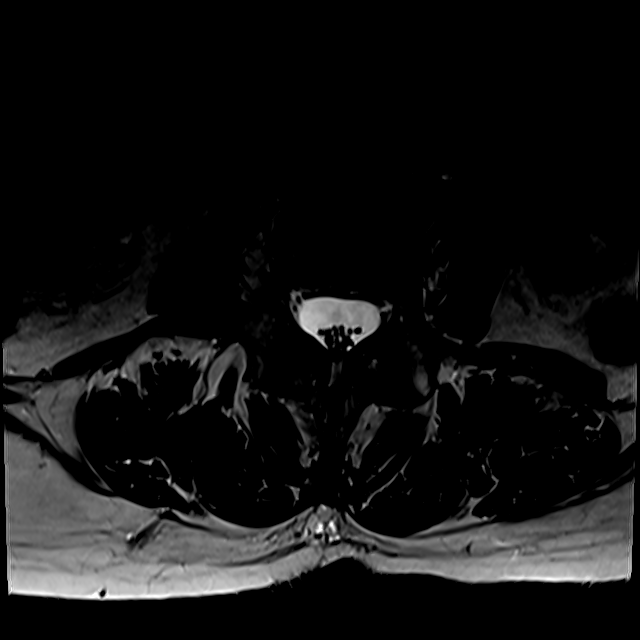
[im 33/39]
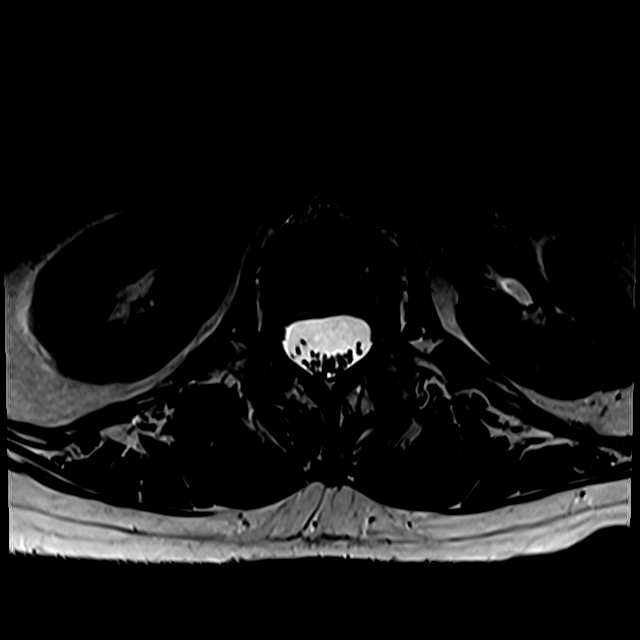

[18 of 48 positions shown; findings below may reference images not displayed]

FINDINGS: Segmentation:  Standard.

Alignment:  Grade 1 anterolisthesis at L3-4 and L4-5.

Vertebrae:  No fracture, evidence of discitis, or bone lesion.

Conus medullaris and cauda equina: Conus extends to the L1 level.
Conus and cauda equina appear normal.

Paraspinal and other soft tissues: No significant incidental
finding.

Disc levels:

T12- L1: Unremarkable.

L1-L2: Minor disc bulging with small right inferior foraminal
protrusion.

L2-L3: Disc narrowing and circumferential bulging. Mild posterior
element hypertrophy. No neural impingement

L3-L4: Facet osteoarthritis with hypertrophy and anterolisthesis.
The disc is bulging and there is advanced spinal stenosis.
Biforaminal L3 impingement

L4-L5: Facet osteoarthritis that is advanced with anterolisthesis.
Disc narrowing and bulging with fissuring. Moderate to advanced
spinal stenosis, both subarticular recesses are effaced. Moderate
bilateral foraminal narrowing

L5-S1:Mild disc bulging and facet spurring.  No impingement
IMPRESSION: 1. Degenerative disease that is advanced at L3-4 and L4-5 where
there is anterolisthesis.
2. L3-4 advanced spinal stenosis and biforaminal impingement.
3. L4-5 moderate to advanced spinal stenosis and moderate foraminal
narrowing.

## 2020-07-17 ENCOUNTER — Other Ambulatory Visit: Payer: Self-pay

## 2020-07-17 ENCOUNTER — Ambulatory Visit (HOSPITAL_BASED_OUTPATIENT_CLINIC_OR_DEPARTMENT_OTHER): Payer: Medicare Other | Attending: Internal Medicine | Admitting: Physical Therapy

## 2020-07-17 ENCOUNTER — Encounter (HOSPITAL_BASED_OUTPATIENT_CLINIC_OR_DEPARTMENT_OTHER): Payer: Self-pay | Admitting: Physical Therapy

## 2020-07-17 DIAGNOSIS — R296 Repeated falls: Secondary | ICD-10-CM | POA: Insufficient documentation

## 2020-07-17 DIAGNOSIS — M6281 Muscle weakness (generalized): Secondary | ICD-10-CM | POA: Diagnosis present

## 2020-07-17 NOTE — Therapy (Signed)
[facility] [address] [place], [place], [address] Phone: [phone]   Fax:  [phone]  Physical Therapy Evaluation  Patient Details  Name: Autumn Paul MRN: [id number] Date of Birth: [birth year] Referring Provider (PT): Cleatis Polka., MD   Encounter Date: 07/17/2020   PT End of Session - 07/17/20 0850    Visit Number 1    Number of Visits 10    Date for PT Re-Evaluation 08/18/20    Authorization Type UHC MCR    Progress Note Due on Visit 10    PT Start Time 0846    PT Stop Time 0924    PT Time Calculation (min) 38 min    Activity Tolerance Patient tolerated treatment well    Behavior During Therapy [facility] for tasks assessed/performed           Past Medical History:  Diagnosis Date  . 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 Hyperlipidemia     Past Surgical History:  Procedure Laterality Date  . CARPAL TUNNEL RELEASE     right wrist  . CATARACT EXTRACTION    . COLONOSCOPY    . RHINOPLASTY    . TUBAL LIGATION      There were no vitals filed for this visit.    Subjective Assessment - 07/17/20 0851    Subjective Larey Seat about 5 mo ago when a dog pulled her. History of OA beginning many years ago. Shots in knees for pain. Larey Seat about a year ago when moving and was in crawl space- fell on side which led to LBP. Did aquatic therapy for spinal stenosis. Burning in feet in the AM. Knees back and neck are particularly sensitive. took celebrex this AM so its about a 4/10, gets pretty intense into the night.    Patient Stated Goals decrease pain- wakes at night. improve balance, build muscle    Currently in Pain? Yes    Pain Score 4     Pain Location --   overall full body pain   Aggravating Factors  AM & late in day    Pain Relieving Factors celebrext, tylenol              [facility] PT Assessment - 07/17/20 0001      Assessment   Medical Diagnosis OP & falls     Referring Provider (PT) Cleatis Polka., MD    Onset Date/Surgical Date --   about 1 year   Hand Dominance Right      Precautions   Precautions Fall      Restrictions   Weight Bearing Restrictions No      Balance Screen   Has the patient fallen in the past 6 months Yes    How many times? 1    Has the patient had a decrease in activity level because of a fear of falling?  Yes    Is the patient reluctant to leave their home because of a fear of falling?  No      Home Environment   Living Environment Private residence    Living Arrangements Spouse/significant other    Additional Comments stairs but not frequently used      Prior Function   Level of Independence Independent      Cognition   Overall Cognitive Status Within Functional Limits for tasks assessed      Sensation   Additional Comments burning in feet but denies numbness  ROM / Strength   AROM / PROM / Strength AROM;Strength      Strength   Overall Strength Comments UE gross 4/5; knees 5/5    Strength Assessment Site Hip    Right/Left Hip Right;Left    Right Hip Flexion 4+/5    Right Hip Extension 4/5   meas in supine due to stenosis   Right Hip ABduction 4/5    Left Hip Flexion 4+/5    Left Hip Extension 4/5   meas in supine due to stenois   Left Hip ABduction 4/5      Special Tests   Other special tests 5TSTS 15s      Standardized Balance Assessment   Standardized Balance Assessment Berg Balance Test      Berg Balance Test   Sit to Stand Able to stand without using hands and stabilize independently    Standing Unsupported Able to stand safely 2 minutes    Sitting with Back Unsupported but Feet Supported on Floor or Stool Able to sit safely and securely 2 minutes    Stand to Sit Sits safely with minimal use of hands    Transfers Able to transfer safely, minor use of hands    Standing Unsupported with Eyes Closed Able to stand 10 seconds with supervision    Standing Unsupported with Feet Together  Able to place feet together independently and stand for 1 minute with supervision    From Standing, Reach Forward with Outstretched Arm Can reach forward >12 cm safely (5")    From Standing Position, Pick up Object from Floor Able to pick up shoe safely and easily    From Standing Position, Turn to Look Behind Over each Shoulder Looks behind from both sides and weight shifts well    Turn 360 Degrees Able to turn 360 degrees safely in 4 seconds or less    Standing Unsupported, Alternately Place Feet on Step/Stool Able to complete 4 steps without aid or supervision    Standing Unsupported, One Foot in Front Needs help to step but can hold 15 seconds    Standing on One Leg Tries to lift leg/unable to hold 3 seconds but remains standing independently    Total Score 45                      Objective measurements completed on examination: See above findings.               PT Education - 07/17/20 2056    Education Details anatomy of condition, POC, HEP, aquatics    Person(s) Educated Patient    Methods Explanation    Comprehension Verbalized understanding;Need further instruction               PT Long Term Goals - 07/17/20 2115      PT LONG TERM GOAL #1   Title 5TSTS to 12s    Baseline 15s a eval    Time 6    Period Weeks    Status New    Target Date 08/18/20      PT LONG TERM GOAL #2   Title BERG to improve by MDC    Baseline see flowsheet    Time 6    Period Weeks    Status New    Target Date 08/18/20      PT LONG TERM GOAL #3   Title bil hip abd to 5/5    Baseline see flowsheet    Time 6  Period Weeks    Status New    Target Date 08/18/20                  Plan - 07/17/20 2101    Clinical Impression Statement Pt presents to PT with complaints of osteoporosis and frequent falls. Balance and strength testing reveal need for PT treatment to improve functional ability and safety. Pt has done aquatic exercise in the past and felt that  this was most helpful. She will continue at 2/week as water will allow for strengthening and decrease risk of further falls. To be re-evaluated on land at the end of the month.    Personal Factors and Comorbidities Fitness;Age;Comorbidity 1    Comorbidities osteoporosis    Examination-Activity Limitations Squat;Bed Mobility;Stairs;Lift;Stand;Locomotion Level;Carry;Transfers;Sit    Examination-Participation Restrictions Shop;Meal Prep;Driving    Stability/Clinical Decision Making Evolving/Moderate complexity    Clinical Decision Making Moderate    Rehab Potential Good    PT Frequency 2x / week    PT Duration 6 weeks    PT Treatment/Interventions ADLs/Self Care Home Management;Cryotherapy;Aquatic Therapy;Moist Heat;Electrical Stimulation;Functional mobility training;Neuromuscular re-education;Balance training;Therapeutic exercise;Therapeutic activities;Patient/family education;Manual techniques;Passive range of motion;Dry needling;Taping    PT Next Visit Plan aquatics- strength and balance    PT Home Exercise Plan to be set in pool    Consulted and Agree with Plan of Care Patient           Patient will benefit from skilled therapeutic intervention in order to improve the following deficits and impairments:  Decreased balance,Difficulty walking,Improper body mechanics,Postural dysfunction,Pain,Decreased strength,Decreased activity tolerance  Visit Diagnosis: Repeated falls - Plan: PT plan of care cert/re-cert  Muscle weakness (generalized) - Plan: PT plan of care cert/re-cert     Problem List Patient Active Problem List   Diagnosis Date Noted  . After cataract of left eye not obscuring vision 07/26/2014  . Glaucoma suspect, both eyes 08/02/2013  . Conjunctivochalasis 04/07/2012  . Dermatochalasis 04/07/2012  . Dry eyes 04/07/2012  . 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  . Meibomian gland dysfunction 04/07/2012  . Myopia with astigmatism and presbyopia 04/07/2012  . 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 Posterior vitreous detachment 04/07/2012  . Vitreous syneresis 04/07/2012   Keshon Markovitz C. Marigny Borre PT, DPT 07/17/20 9:19 PM   [facility] [address] [place], [place], [address] Phone: [phone]   Fax:  [phone]  Name: Autumn Paul MRN: [id number] Date of Birth: 01/20/1946

## 2020-07-18 ENCOUNTER — Ambulatory Visit (HOSPITAL_BASED_OUTPATIENT_CLINIC_OR_DEPARTMENT_OTHER): Payer: Medicare Other | Admitting: Physical Therapy

## 2020-07-18 ENCOUNTER — Encounter (HOSPITAL_BASED_OUTPATIENT_CLINIC_OR_DEPARTMENT_OTHER): Payer: Self-pay | Admitting: Physical Therapy

## 2020-07-18 DIAGNOSIS — R296 Repeated falls: Secondary | ICD-10-CM

## 2020-07-18 DIAGNOSIS — M6281 Muscle weakness (generalized): Secondary | ICD-10-CM

## 2020-07-18 NOTE — Therapy (Addendum)
[facility] [address] [place], [place], [address] Phone: [phone]   Fax:  [phone]  Physical Therapy Treatment  Patient Details  Name: Autumn Paul MRN: [id number] Date of Birth: [birth year] Referring Provider (PT): Autumn Paul., MD   Encounter Date: 07/18/2020   PT End of Session - 07/18/20 1141     Visit Number 2    Number of Visits 10    PT Start Time 0945    PT Stop Time 1030    PT Time Calculation (min) 45 min    Equipment Utilized During Treatment Other (comment)   aquatic barbells and foam cuffs   Activity Tolerance Patient tolerated treatment well             Past Medical History:  Diagnosis Date   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   Subjective Assessment - 07/18/20 1312     Subjective Pt reports feeling encouraged by beginning aquatic therapy as she is confident it will improve her balance    Currently in Pain? Yes    Pain Score 5     Pain Location Back    Pain Orientation Mid    Pain Descriptors / Indicators Aching    Pain Type Chronic pain                                [facility] Adult PT Treatment/Exercise - 07/18/20 0001       Balance   Balance comment 15 mins   static and dynamic balance challenges supported decreasing to unsupported using Ai Chi positions 4 and 6     Exercises   Exercises Other Exercises      Knee/Hip Exercises: Stretches   Active Hamstring Stretch Both;30 seconds    Gastroc Stretch Both;30 seconds      Knee/Hip Exercises: Aerobic   Other Aerobic water walking forward, backward and side stepping      Knee/Hip Exercises: Standing   Heel Raises Both;20  reps    Knee Flexion Other (comment)   resisted with foam cuff 2x10   Hip Flexion Other (comment)   both resisted with foam cuff 2x10   Hip ADduction --   foam cuf 2x10 REPS   Hip Abduction Other (comment)   foam cuff 2x10   Hip Extension Other (comment)   2x10 reps with foam cuff   Other Standing Knee Exercises 10   hip circular rotation resisted with foam cuff            Pt seen for aquatic therapy today.  Treatment took place in water 3.25-4 ft in depth at the [facility] pool. Temp of water was 88- 91.  Pt entered/exited the pool via stairs using step to pattern with bilat rail and CGA. Pt requires buoyancy for support and to offload joints with strengthening exercises. Viscosity of the water is needed for resistance of strengthening; water current perturbations provides challenge to standing balance unsupported, requiring increased core activation.        PT Education - 07/18/20 1316  Education Details importance of core strength on balance    Person(s) Educated Patient    Methods Explanation    Comprehension Verbalized understanding                 PT Long Term Goals - 07/17/20 2115       PT LONG TERM GOAL #1   Title 5TSTS to 12s    Baseline 15s a eval    Time 6    Period Weeks    Status New    Target Date 08/18/20      PT LONG TERM GOAL #2   Title BERG to improve by MDC    Baseline see flowsheet    Time 6    Period Weeks    Status New    Target Date 08/18/20      PT LONG TERM GOAL #3   Title bil hip abd to 5/5    Baseline see flowsheet    Time 6    Period Weeks    Status New    Target Date 08/18/20                   Plan - 07/18/20 1333     Clinical Impression Statement Pt without hesitation in aquatic setting, participates in various positions using the properties of water and the resistence of foam with and without upper extremity support, challenged in areas of strengthening, ROM, stretching and balancing using  various techniques.  Pt not only tolerates but enjoys session.    PT Next Visit Plan advance balance challenges and LB stretching.             Patient will benefit from skilled therapeutic intervention in order to improve the following deficits and impairments:  ADLs/Self Care Home Management;Cryotherapy;Aquatic Therapy;Moist Heat;Electrical Stimulation;Functional mobility training;Neuromuscular re-education;Balance training;Therapeutic exercise;Therapeutic activities;Patient/family education;Manual techniques;Passive range of motion;Dry needling;Taping;  Visit Diagnosis:        P   ICD-10-CM   PL               1.   Repeated falls  R29.6  Change Dx     2.   Muscle weakness (generalized)  M62.81  Change Dx            Problem List Patient Active Problem List   Diagnosis Date Noted   After cataract of left eye not obscuring vision 07/26/2014   Glaucoma suspect, both eyes 08/02/2013   Conjunctivochalasis 04/07/2012   Dermatochalasis 04/07/2012   Dry eyes 04/07/2012   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   Meibomian gland dysfunction 04/07/2012   Myopia with astigmatism and presbyopia 04/07/2012   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   Posterior vitreous detachment 04/07/2012   Vitreous syneresis 04/07/2012   [name]) [name] MPT  07/18/2020, 1:49 PM  [facility] [address] [place], [place], [address] Phone: [phone]   Fax:  [phone]  Name: Autumn Paul MRN: [id number] Date of Birth: 23-Tucciarone-1947

## 2020-07-20 ENCOUNTER — Ambulatory Visit (HOSPITAL_BASED_OUTPATIENT_CLINIC_OR_DEPARTMENT_OTHER): Payer: Medicare Other | Admitting: Physical Therapy

## 2020-07-20 ENCOUNTER — Other Ambulatory Visit: Payer: Self-pay

## 2020-07-20 ENCOUNTER — Encounter (HOSPITAL_BASED_OUTPATIENT_CLINIC_OR_DEPARTMENT_OTHER): Payer: Self-pay | Admitting: Physical Therapy

## 2020-07-20 DIAGNOSIS — R296 Repeated falls: Secondary | ICD-10-CM | POA: Diagnosis not present

## 2020-07-20 DIAGNOSIS — M6281 Muscle weakness (generalized): Secondary | ICD-10-CM

## 2020-07-20 NOTE — Therapy (Signed)
[facility] [address] [place], [place], [address] Phone: [phone]   Fax:  [phone]  Physical Therapy Treatment  Patient Details  Name: Autumn Paul MRN: [id number] Date of Birth: [birth year] Referring Provider (PT): Cleatis Polka., MD   Encounter Date: 07/20/2020   PT End of Session - 07/20/20 1214    Visit Number 3    Number of Visits 10    Date for PT Re-Evaluation 08/18/20    Authorization Type UHC MCR    PT Start Time 0942    PT Stop Time 1030    PT Time Calculation (min) 48 min    Equipment Utilized During Treatment Other (comment)    Activity Tolerance Patient tolerated treatment well    Behavior During Therapy [facility] for tasks assessed/performed           Past Medical History:  Diagnosis Date  . 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 Hyperlipidemia     Past Surgical History:  Procedure Laterality Date  . CARPAL TUNNEL RELEASE     right wrist  . CATARACT EXTRACTION    . COLONOSCOPY    . RHINOPLASTY    . TUBAL LIGATION      There were no vitals filed for this visit.   Subjective Assessment - 07/20/20 1204    Subjective " I felt good after my last treatement, was just tired".    Pain Score 2     Pain Location Back    Pain Type Chronic pain                             [facility] Adult PT Treatment/Exercise - 07/20/20 0001      Exercises   Exercises --          Pt completes warm up x 5 mins walking forward, backward, and side stepping, increasing step length and speed.   Core strengthening using noodle for planks and kick board for rotation 3 x 10 reps, bicycling using waist belt and noodle with manual support from therapist. LE strengthening and balance challenges using foam ankle weights knee lifts supported unilaterally by wall to unsupported. Ai Chi postures 2 and 6  Pt seen for aquatic therapy today.  Treatment took  place in water 3.25-4 ft in depth at the [facility] pool. Temp of water was 91.  Pt entered/exited the pool via stairs (step to pattern) independently with bilat rail. Pt requires buoyancy for support and to offload joints with strengthening exercises. Viscosity of the water is needed for resistance of strengthening; water current perturbations provides challenge to standing balance unsupported, requiring increased core activation.              PT Long Term Goals - 07/17/20 2115      PT LONG TERM GOAL #1   Title 5TSTS to 12s    Baseline 15s a eval    Time 6    Period Weeks    Status New    Target Date 08/18/20      PT LONG TERM GOAL #2   Title BERG to improve by MDC    Baseline see flowsheet    Time 6    Period Weeks    Status New    Target Date 08/18/20      PT LONG TERM GOAL #3   Title bil hip abd to 5/5  Baseline see flowsheet    Time 6    Period Weeks    Status New    Target Date 08/18/20                 Plan - 07/20/20 1219    Clinical Impression Statement Concentration today on core strength and stretching of LB/LE's.  She completes active assisted exercises with buoyancy supported for strengthening & ROM exercises. Pt tolerates well with decreased LB discomfort.    PT Treatment/Interventions ADLs/Self Care Home Management;Cryotherapy;Aquatic Therapy;Moist Heat;Electrical Stimulation;Functional mobility training;Neuromuscular re-education;Balance training;Therapeutic exercise;Therapeutic activities;Patient/family education;Manual techniques;Passive range of motion;Dry needling;Taping    PT Next Visit Plan Progress stretching, core strengthening and aerobic capacity challenges    PT Home Exercise Plan to be set in pool           Patient will benefit from skilled therapeutic intervention in order to improve the following deficits and impairments:  Decreased balance,Difficulty walking,Improper body mechanics,Postural  dysfunction,Pain,Decreased strength,Decreased activity tolerance  Visit Diagnosis: Repeated falls  Muscle weakness (generalized)     Problem List Patient Active Problem List   Diagnosis Date Noted  . After cataract of left eye not obscuring vision 07/26/2014  . Glaucoma suspect, both eyes 08/02/2013  . Conjunctivochalasis 04/07/2012  . Dermatochalasis 04/07/2012  . Dry eyes 04/07/2012  . 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  . Meibomian gland dysfunction 04/07/2012  . Myopia with astigmatism and presbyopia 04/07/2012  . 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  . Posterior vitreous detachment 04/07/2012  . Vitreous syneresis 04/07/2012    Jeanmarie Hubert MPT 07/20/2020, 12:58 PM  [facility] [address] [place], [place], [address] Phone: [phone]   Fax:  [phone]  Name: Autumn Paul MRN: [id number] Date of Birth: 07/23/1945

## 2020-07-25 ENCOUNTER — Encounter (HOSPITAL_BASED_OUTPATIENT_CLINIC_OR_DEPARTMENT_OTHER): Payer: Self-pay | Admitting: Physical Therapy

## 2020-07-25 ENCOUNTER — Ambulatory Visit (HOSPITAL_BASED_OUTPATIENT_CLINIC_OR_DEPARTMENT_OTHER): Payer: Medicare Other | Admitting: Physical Therapy

## 2020-07-25 ENCOUNTER — Other Ambulatory Visit: Payer: Self-pay

## 2020-07-25 DIAGNOSIS — R296 Repeated falls: Secondary | ICD-10-CM

## 2020-07-25 DIAGNOSIS — M6281 Muscle weakness (generalized): Secondary | ICD-10-CM

## 2020-07-25 NOTE — Patient Instructions (Signed)
Pt instruction on posterior pelvic tilts in standing and supine.  She is instructed to complete 2 sets of 10 each time she is lying in bed (x2 daily).

## 2020-07-25 NOTE — Therapy (Signed)
[facility] [address] [place], [place], [address] Phone: [phone]   Fax:  [phone]  Physical Therapy Treatment  Patient Details  Name: Autumn Paul MRN: [id number] Date of Birth: [birth year] Referring Provider (PT): Cleatis Polka., MD   Encounter Date: 07/25/2020   PT End of Session - 07/25/20 1256    Visit Number 4    Number of Visits 10    Date for PT Re-Evaluation 08/18/20    Authorization Type UHC MCR    PT Start Time 0946    PT Stop Time 1031    PT Time Calculation (min) 45 min    Equipment Utilized During Treatment Other (comment)    Activity Tolerance Patient tolerated treatment well    Behavior During Therapy [facility] for tasks assessed/performed           Past Medical History:  Diagnosis Date  . 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 Hyperlipidemia     Past Surgical History:  Procedure Laterality Date  . CARPAL TUNNEL RELEASE     right wrist  . CATARACT EXTRACTION    . COLONOSCOPY    . RHINOPLASTY    . TUBAL LIGATION      There were no vitals filed for this visit.   Subjective Assessment - 07/25/20 1251    Subjective "I am trying without my aquatic shoes today to see if it makes a difference, my back hurt me quite a bit last night, ok today"           TREATMENT: Pt seen for aquatic therapy today.  Treatment took place in water 3.25-4.8 ft in depth at the [facility] pool. Temp of water was 91.  Pt entered/exited the pool via stairs step to pattern independently with bilat rail. Warm up: forward, backward and side stepping/walking cues for increased step length, increased speed, hand placement to increase resistance. Stretching: gastroc, hamstrings, glutes, IR/ER and LB on wall and sitting on bench of pool.  Using buoyancy belt, kick board, noodles and sqoodles, pt is directed in LE and core strengthening  ex: add/abd, marching,  knee lifts, planks. Balance static and dynamic: planks static and walking, knee lift with manual perturbations Pt requires buoyancy for support and to offload joints with strengthening exercises. Viscosity of the water is needed for resistance of strengthening; water current perturbations provides challenge to standing balance unsupported, requiring increased core activation.                               PT Long Term Goals - 07/17/20 2115      PT LONG TERM GOAL #1   Title 5TSTS to 12s    Baseline 15s a eval    Time 6    Period Weeks    Status New    Target Date 08/18/20      PT LONG TERM GOAL #2   Title BERG to improve by MDC    Baseline see flowsheet    Time 6    Period Weeks    Status New    Target Date 08/18/20      PT LONG TERM GOAL #3   Title bil hip abd to 5/5    Baseline see flowsheet    Time 6    Period Weeks    Status New    Target Date 08/18/20  Plan - 07/25/20 1257    Personal Factors and Comorbidities Fitness;Age;Comorbidity 1    Comorbidities osteoporosis    Examination-Activity Limitations Squat;Bed Mobility;Stairs;Lift;Stand;Locomotion Level;Carry;Transfers;Sit    Examination-Participation Restrictions Shop;Meal Prep;Driving    Stability/Clinical Decision Making Stable/Uncomplicated    Clinical Decision Making Moderate    Rehab Potential Good    PT Frequency 2x / week    PT Duration 6 weeks    PT Treatment/Interventions ADLs/Self Care Home Management;Cryotherapy;Aquatic Therapy;Moist Heat;Electrical Stimulation;Functional mobility training;Neuromuscular re-education;Balance training;Therapeutic exercise;Therapeutic activities;Patient/family education;Manual techniques;Passive range of motion;Dry needling;Taping    PT Next Visit Plan Progress stretching, core strengthening and aerobic capacity challenges           Patient will benefit from skilled therapeutic intervention in order to improve the  following deficits and impairments:  Decreased balance,Difficulty walking,Improper body mechanics,Postural dysfunction,Pain,Decreased strength,Decreased activity tolerance  Visit Diagnosis: Repeated falls  Muscle weakness (generalized)     Problem List Patient Active Problem List   Diagnosis Date Noted  . After cataract of left eye not obscuring vision 07/26/2014  . Glaucoma suspect, both eyes 08/02/2013  . Conjunctivochalasis 04/07/2012  . Dermatochalasis 04/07/2012  . Dry eyes 04/07/2012  . 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  . Meibomian gland dysfunction 04/07/2012  . Myopia with astigmatism and presbyopia 04/07/2012  . 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  . Posterior vitreous detachment 04/07/2012  . Vitreous syneresis 04/07/2012    Jeanmarie Hubert MPT 07/25/2020, 1:01 PM  [facility] [address] [place], [place], [address] Phone: [phone]   Fax:  [phone]  Name: Autumn Paul MRN: [id number] Date of Birth: June 08, 1945

## 2020-07-27 ENCOUNTER — Ambulatory Visit (HOSPITAL_BASED_OUTPATIENT_CLINIC_OR_DEPARTMENT_OTHER): Payer: Medicare Other | Admitting: Physical Therapy

## 2020-08-01 ENCOUNTER — Ambulatory Visit (HOSPITAL_BASED_OUTPATIENT_CLINIC_OR_DEPARTMENT_OTHER): Payer: Medicare Other | Admitting: Physical Therapy

## 2020-08-01 ENCOUNTER — Other Ambulatory Visit: Payer: Self-pay

## 2020-08-01 ENCOUNTER — Encounter (HOSPITAL_BASED_OUTPATIENT_CLINIC_OR_DEPARTMENT_OTHER): Payer: Self-pay | Admitting: Physical Therapy

## 2020-08-01 DIAGNOSIS — M6281 Muscle weakness (generalized): Secondary | ICD-10-CM

## 2020-08-01 DIAGNOSIS — R296 Repeated falls: Secondary | ICD-10-CM | POA: Diagnosis not present

## 2020-08-01 NOTE — Therapy (Signed)
[facility] [address] [place], [place], [address] Phone: [phone]   Fax:  [phone]  Physical Therapy Treatment  Patient Details  Name: Autumn Paul MRN: [id number] Date of Birth: Steffenhagen 19, 1947 Referring Provider (PT): Cleatis Polka., MD   Encounter Date: 08/01/2020   PT End of Session - 08/01/20 1259    Visit Number 5    Number of Visits 10    Date for PT Re-Evaluation 08/18/20    Authorization Type UHC MCR    Progress Note Due on Visit 10    PT Start Time [phone]    PT Stop Time 1032    PT Time Calculation (min) 45 min    Equipment Utilized During Treatment Other (comment)    Activity Tolerance Patient tolerated treatment well    Behavior During Therapy [facility] for tasks assessed/performed           Past Medical History:  Diagnosis Date  . 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 Hyperlipidemia     Past Surgical History:  Procedure Laterality Date  . CARPAL TUNNEL RELEASE     right wrist  . CATARACT EXTRACTION    . COLONOSCOPY    . RHINOPLASTY    . TUBAL LIGATION      There were no vitals filed for this visit.   Subjective Assessment - 08/01/20 1257    Subjective "Top of my right foot is hirting today.  I have been walking 20 mins daily wearing my aqua shoes, maybe I need a new pair of shoes?"             TREATMENT Pt seen for aquatic therapy today.  Treatment took place in water 3.25-4.8 ft in depth at the [facility] pool. Temp of water was 91.  Pt entered/exited the pool via stairs step to pattern independently with bilat rail. Warm up: forward, backward and side stepping/walking cues for increased step length, increased speed, hand placement to increase resistance. Stretching: gastroc, hamstrings, glutes, and LB   pt is directed in LE and core strengthening  ex: add/abd, marching, knee lifts, hip extension and flex, planks, barbells shld  flex/ext and abd.  Pt also instructed on HEP using frozen water bottle, rolling under foot to relieve plantar foot discomfort. Static balance challenges incorporated with core strengthening Pt requires buoyancy for support and to offload joints with strengthening exercises. Viscosity of the water is needed for resistance of strengthening; water current perturbations provides challenge to standing balance unsupported, requiring increased core activation.                             PT Long Term Goals - 07/17/20 2115      PT LONG TERM GOAL #1   Title 5TSTS to 12s    Baseline 15s a eval    Time 6    Period Weeks    Status New    Target Date 08/18/20      PT LONG TERM GOAL #2   Title BERG to improve by MDC    Baseline see flowsheet    Time 6    Period Weeks    Status New    Target Date 08/18/20      PT LONG TERM GOAL #3   Title bil hip abd to 5/5    Baseline see flowsheet    Time 6    Period Weeks  Status New    Target Date 08/18/20                 Plan - 08/01/20 1303    Clinical Impression Statement Pt with new right foot discomfort probably due to taking walks wearing aquatic shoes.  She is instr to wear a better shoe with arch support when walking instead.  Pt continues with difficulty gaining and maintaining a post pelvic tilt.  PT demonstrates for pt on land for improved understanding.  No c/o lb discomfort.  Continue with lumbar distraction techniques and passive core stretching.    Personal Factors and Comorbidities Fitness;Age;Comorbidity 1    Comorbidities osteoporosis    Examination-Activity Limitations Squat;Bed Mobility;Stairs;Lift;Stand;Locomotion Level;Carry;Transfers;Sit    Examination-Participation Restrictions Shop;Meal Prep;Driving    Stability/Clinical Decision Making Stable/Uncomplicated    Rehab Potential Good    PT Frequency 2x / week    PT Duration 6 weeks    PT Treatment/Interventions ADLs/Self Care Home  Management;Cryotherapy;Aquatic Therapy;Moist Heat;Electrical Stimulation;Functional mobility training;Neuromuscular re-education;Balance training;Therapeutic exercise;Therapeutic activities;Patient/family education;Manual techniques;Passive range of motion;Dry needling;Taping    PT Next Visit Plan step climbing    PT Home Exercise Plan to be set in pool    Consulted and Agree with Plan of Care Patient           Patient will benefit from skilled therapeutic intervention in order to improve the following deficits and impairments:  Decreased balance,Difficulty walking,Improper body mechanics,Postural dysfunction,Pain,Decreased strength,Decreased activity tolerance  Visit Diagnosis: Repeated falls  Muscle weakness (generalized)     Problem List Patient Active Problem List   Diagnosis Date Noted  . After cataract of left eye not obscuring vision 07/26/2014  . Glaucoma suspect, both eyes 08/02/2013  . Conjunctivochalasis 04/07/2012  . Dermatochalasis 04/07/2012  . Dry eyes 04/07/2012  . 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  . Meibomian gland dysfunction 04/07/2012  . Myopia with astigmatism and presbyopia 04/07/2012  . 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  . Posterior vitreous detachment 04/07/2012  . Vitreous syneresis 04/07/2012    Jeanmarie Hubert MPT 08/01/2020, 1:11 PM  [facility] [address] [place], [place], [address] Phone: [phone]   Fax:  [phone]  Name: Autumn Paul MRN: [id number] Date of Birth: 1945-08-04

## 2020-08-03 ENCOUNTER — Encounter (HOSPITAL_BASED_OUTPATIENT_CLINIC_OR_DEPARTMENT_OTHER): Payer: Self-pay | Admitting: Physical Therapy

## 2020-08-03 ENCOUNTER — Ambulatory Visit (HOSPITAL_BASED_OUTPATIENT_CLINIC_OR_DEPARTMENT_OTHER): Payer: Medicare Other | Admitting: Physical Therapy

## 2020-08-03 ENCOUNTER — Other Ambulatory Visit: Payer: Self-pay

## 2020-08-03 DIAGNOSIS — R296 Repeated falls: Secondary | ICD-10-CM | POA: Diagnosis not present

## 2020-08-03 DIAGNOSIS — M6281 Muscle weakness (generalized): Secondary | ICD-10-CM

## 2020-08-03 NOTE — Therapy (Signed)
[facility] [address] [place], [place], [address] Phone: [phone]   Fax:  [phone]  Physical Therapy Treatment  Patient Details  Name: Autumn Paul MRN: [id number] Date of Birth: Nov 10, 1945 Referring Provider (PT): Cleatis Polka., MD   Encounter Date: 08/03/2020   PT End of Session - 08/03/20 1324    Visit Number 6    Number of Visits 10    Date for PT Re-Evaluation 08/18/20    Authorization Type UHC MCR    Progress Note Due on Visit 10    PT Start Time 0946    PT Stop Time 1030    PT Time Calculation (min) 44 min    Equipment Utilized During Treatment Other (comment)    Activity Tolerance Patient tolerated treatment well    Behavior During Therapy [facility] for tasks assessed/performed           Past Medical History:  Diagnosis Date  . 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 Hyperlipidemia     Past Surgical History:  Procedure Laterality Date  . CARPAL TUNNEL RELEASE     right wrist  . CATARACT EXTRACTION    . COLONOSCOPY    . RHINOPLASTY    . TUBAL LIGATION      There were no vitals filed for this visit.   Subjective Assessment - 08/03/20 1322    Subjective my feet are much better after using the frozen water bottle, my back has been better as well.    Currently in Pain? Yes    Pain Score 2     Pain Location Back    Pain Orientation Mid    Pain Descriptors / Indicators Aching    Pain Type Chronic pain    Pain Onset More than a month ago    Pain Frequency Intermittent    Aggravating Factors  prolonged walking or standing    Pain Relieving Factors meds    Multiple Pain Sites No           TREATMENT Pt seen for aquatic therapy today.  Treatment took place in water 3.25-4.8 ft in depth at the [facility] pool. Temp of water was 91.  Pt entered/exited the pool via stairs step to pattern independently with bilat rail. Warm up: forward,  backward and side stepping/walking cues for increased step length, increased speed, hand placement to increase resistance. Used hand paddles for resistence Stretching: gastroc, hamstrings and sacrospinals in seated position.  Post pelvic tilts and core stabilization ex.  Standing hip extensors and quads Strengthening and balance: Core using kick board and noodle, with core stabilized, planks and pushing/pulling.  Hip/knee flex/ext resisted by noodle. Noodle push down Pt requires buoyancy for support and to offload joints with strengthening exercises. Viscosity of the water is needed for resistance of strengthening; water current perturbations provides challenge to standing balance unsupported, requiring increased core activation.                          PT Education - 08/03/20 1826    Education Details sup to and from sit transfers (demonstrated on jacuzzi wall) using proper technique, rolling and pushing up to decrease initial morning pain exiting from bed.    Person(s) Educated Patient    Methods Explanation;Demonstration    Comprehension Verbalized understanding               PT Long Term Goals -  07/17/20 2115      PT LONG TERM GOAL #1   Title 5TSTS to 12s    Baseline 15s a eval    Time 6    Period Weeks    Status New    Target Date 08/18/20      PT LONG TERM GOAL #2   Title BERG to improve by MDC    Baseline see flowsheet    Time 6    Period Weeks    Status New    Target Date 08/18/20      PT LONG TERM GOAL #3   Title bil hip abd to 5/5    Baseline see flowsheet    Time 6    Period Weeks    Status New    Target Date 08/18/20                 Plan - 08/03/20 1830    Clinical Impression Statement Pt with overall decrease in pain with reports of increased activity.  Has inital pain in am with getting OOB.  She demonstrates improved core strength and balance with activity holding positions with morecontrol and for  longer periods     Personal Factors and Comorbidities Fitness;Age;Comorbidity 1    Comorbidities osteoporosis    Examination-Activity Limitations Squat;Bed Mobility;Stairs;Lift;Stand;Locomotion Level;Carry;Transfers;Sit    Examination-Participation Restrictions Shop;Meal Prep;Driving    Stability/Clinical Decision Making Stable/Uncomplicated    Clinical Decision Making Moderate    Rehab Potential Good    PT Frequency 2x / week    PT Duration 6 weeks    PT Treatment/Interventions ADLs/Self Care Home Management;Cryotherapy;Aquatic Therapy;Moist Heat;Electrical Stimulation;Functional mobility training;Neuromuscular re-education;Balance training;Therapeutic exercise;Therapeutic activities;Patient/family education;Manual techniques;Passive range of motion;Dry needling;Taping    PT Next Visit Plan step climbing    PT Home Exercise Plan to be set in pool           Patient will benefit from skilled therapeutic intervention in order to improve the following deficits and impairments:  Decreased balance,Difficulty walking,Improper body mechanics,Postural dysfunction,Pain,Decreased strength,Decreased activity tolerance  Visit Diagnosis: Repeated falls  Muscle weakness (generalized)     Problem List Patient Active Problem List   Diagnosis Date Noted  . After cataract of left eye not obscuring vision 07/26/2014  . Glaucoma suspect, both eyes 08/02/2013  . Conjunctivochalasis 04/07/2012  . Dermatochalasis 04/07/2012  . Dry eyes 04/07/2012  . 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  . Meibomian gland dysfunction 04/07/2012  . Myopia with astigmatism and presbyopia 04/07/2012  . 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  . Posterior vitreous detachment 04/07/2012  . Vitreous syneresis 04/07/2012    Jeanmarie Hubert MPT 08/03/2020, 6:36 PM  [facility] [address] [place], [place], [address] Phone: [phone]   Fax:  [phone]  Name: Autumn Paul MRN:  [id number] Date of Birth: 1945-05-29

## 2020-08-08 ENCOUNTER — Encounter (HOSPITAL_BASED_OUTPATIENT_CLINIC_OR_DEPARTMENT_OTHER): Payer: Self-pay | Admitting: Physical Therapy

## 2020-08-08 ENCOUNTER — Other Ambulatory Visit: Payer: Self-pay

## 2020-08-08 ENCOUNTER — Ambulatory Visit (HOSPITAL_BASED_OUTPATIENT_CLINIC_OR_DEPARTMENT_OTHER): Payer: Medicare Other | Admitting: Physical Therapy

## 2020-08-08 DIAGNOSIS — R296 Repeated falls: Secondary | ICD-10-CM

## 2020-08-08 DIAGNOSIS — M6281 Muscle weakness (generalized): Secondary | ICD-10-CM

## 2020-08-08 NOTE — Therapy (Signed)
[facility] [address] [place], [place], [address] Phone: [phone]   Fax:  [phone]  Physical Therapy Treatment  Patient Details  Name: Autumn Paul MRN: [id number] Date of Birth: May 08, 1945 Referring Provider (PT): Cleatis Polka., MD   Encounter Date: 08/08/2020   PT End of Session - 08/08/20 1339    Visit Number 7    Number of Visits 10    Date for PT Re-Evaluation 08/18/20    Authorization Type UHC MCR    Progress Note Due on Visit 10    PT Start Time 0951    PT Stop Time 1035    PT Time Calculation (min) 44 min    Equipment Utilized During Treatment Other (comment)    Activity Tolerance Patient tolerated treatment well    Behavior During Therapy [facility] for tasks assessed/performed           Past Medical History:  Diagnosis Date  . 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 Hyperlipidemia     Past Surgical History:  Procedure Laterality Date  . CARPAL TUNNEL RELEASE     right wrist  . CATARACT EXTRACTION    . COLONOSCOPY    . RHINOPLASTY    . TUBAL LIGATION      There were no vitals filed for this visit.   Subjective Assessment - 08/08/20 1337    Subjective finally got a new pair of shoes and it has made a big difference, less foot ans back pain.    Limitations Standing    Patient Stated Goals decrease pain- wakes at night. improve balance, build muscle    Currently in Pain? Yes    Pain Score 1     Pain Location Back    Pain Orientation Mid    Pain Descriptors / Indicators Aching    Pain Type Chronic pain    Pain Onset More than a month ago          TREATMENT     Pt seen for aquatic therapy today.  Treatment took place in water 3.25-4.8 ft in depth at the [facility] pool. Temp of water was 91.  Pt entered/exited the pool via stairs step to pattern independently with bilat rail. Warm up: forward, backward and side  stepping/walking cues for increased step length, increased speed, hand placement to increase resistance. Used hand paddles for resistence  Stretching: gastroc, hamstrings and sacrospinals in seated position.  Post pelvic tilts and core stabilization ex. Pt able to activate sacrospinals and hold posterior pelvic tilts with ease.  Standing hip extensors and quads completing noodle pulls  Strengthening and balance: Core using kick board and noodle, with core stabilized.  Hip/knee flex/ext resisted by noodle. Noodle push down 3x10 reps with decreasing ue support of holding to wall then barbells.  Pt requires buoyancy for support and to offload joints with strengthening exercises. Viscosity of the water is needed for resistance of strengthening; water current perturbations provides challenge to standing balance unsupported, requiring increased core activation.                            PT Long Term Goals - 07/17/20 2115      PT LONG TERM GOAL #1   Title 5TSTS to 12s    Baseline 15s a eval    Time 6    Period Weeks    Status New  Target Date 08/18/20      PT LONG TERM GOAL #2   Title BERG to improve by MDC    Baseline see flowsheet    Time 6    Period Weeks    Status New    Target Date 08/18/20      PT LONG TERM GOAL #3   Title bil hip abd to 5/5    Baseline see flowsheet    Time 6    Period Weeks    Status New    Target Date 08/18/20                 Plan - 08/08/20 1341    Clinical Impression Statement Pt reports getting OOB as instructed last visit which has decreased her early morning discomfort.  Feet are better with addition of new sneakers. She reports improved lbp overall the aquatic therapy having a positive impact although continues to have increased difficulties when weather is bad. Pt  with improved ability to maintain standing balance dynamically as demonstrated through completion of le noodle pushdowns and support of barbells.     Personal Factors and Comorbidities Fitness;Age;Comorbidity 1    Comorbidities osteoporosis    Examination-Activity Limitations Squat;Bed Mobility;Stairs;Lift;Stand;Locomotion Level;Carry;Transfers;Sit    Examination-Participation Restrictions Shop;Meal Prep;Driving    Stability/Clinical Decision Making Stable/Uncomplicated    Clinical Decision Making Moderate    Rehab Potential Good    PT Frequency 2x / week    PT Duration 6 weeks    PT Treatment/Interventions ADLs/Self Care Home Management;Cryotherapy;Aquatic Therapy;Moist Heat;Electrical Stimulation;Functional mobility training;Neuromuscular re-education;Balance training;Therapeutic exercise;Therapeutic activities;Patient/family education;Manual techniques;Passive range of motion;Dry needling;Taping    PT Next Visit Plan advance core stregnthening and dynamic balance    PT Home Exercise Plan to be set in pool    Consulted and Agree with Plan of Care Patient           Patient will benefit from skilled therapeutic intervention in order to improve the following deficits and impairments:  Decreased balance,Difficulty walking,Improper body mechanics,Postural dysfunction,Pain,Decreased strength,Decreased activity tolerance  Visit Diagnosis: Repeated falls  Muscle weakness (generalized)     Problem List Patient Active Problem List   Diagnosis Date Noted  . After cataract of left eye not obscuring vision 07/26/2014  . Glaucoma suspect, both eyes 08/02/2013  . Conjunctivochalasis 04/07/2012  . Dermatochalasis 04/07/2012  . Dry eyes 04/07/2012  . 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  . Meibomian gland dysfunction 04/07/2012  . Myopia with astigmatism and presbyopia 04/07/2012  . 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  . Posterior vitreous detachment 04/07/2012  . Vitreous syneresis 04/07/2012    Jeanmarie Hubert MPT 08/08/2020, 1:51 PM  [facility] [address] [place], [place],  [address] Phone: [phone]   Fax:  [phone]  Name: Autumn Paul MRN: [id number] Date of Birth: [birth year]

## 2020-08-10 ENCOUNTER — Ambulatory Visit (HOSPITAL_BASED_OUTPATIENT_CLINIC_OR_DEPARTMENT_OTHER): Payer: Medicare Other | Admitting: Physical Therapy

## 2020-08-10 ENCOUNTER — Other Ambulatory Visit: Payer: Self-pay

## 2020-08-10 DIAGNOSIS — M6281 Muscle weakness (generalized): Secondary | ICD-10-CM

## 2020-08-10 DIAGNOSIS — R296 Repeated falls: Secondary | ICD-10-CM | POA: Diagnosis not present

## 2020-08-11 ENCOUNTER — Encounter (HOSPITAL_BASED_OUTPATIENT_CLINIC_OR_DEPARTMENT_OTHER): Payer: Self-pay | Admitting: Physical Therapy

## 2020-08-11 NOTE — Therapy (Signed)
[facility] [address] [place], [place], [address] Phone: [phone]   Fax:  [phone]  Physical Therapy Treatment  Patient Details  Name: Autumn Paul MRN: [id number] Date of Birth: 11-Giacomo-1947 Referring Provider (PT): Cleatis Polka., MD   Encounter Date: 08/10/2020   PT End of Session - 08/11/20 1158    Visit Number 8    Number of Visits 10    Date for PT Re-Evaluation 08/18/20    Authorization Type UHC MCR    Progress Note Due on Visit 10    PT Start Time 0945    PT Stop Time 1030    PT Time Calculation (min) 45 min    Equipment Utilized During Treatment Other (comment)    Activity Tolerance Patient tolerated treatment well    Behavior During Therapy [facility] for tasks assessed/performed           Past Medical History:  Diagnosis Date  . 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 Hyperlipidemia     Past Surgical History:  Procedure Laterality Date  . CARPAL TUNNEL RELEASE     right wrist  . CATARACT EXTRACTION    . COLONOSCOPY    . RHINOPLASTY    . TUBAL LIGATION      There were no vitals filed for this visit.   Subjective Assessment - 08/11/20 1152    Subjective Pt reporting continued improvement in LBP and feet. Sleeping weel at night. Using new shoes and trying to stay active.    Patient Stated Goals decrease pain- wakes at night. improve balance, build muscle    Currently in Pain? Yes    Pain Score 3     Pain Location Back    Pain Orientation Mid    Pain Descriptors / Indicators Aching    Pain Type Chronic pain    Pain Radiating Towards na    Pain Onset More than a month ago    Pain Frequency Intermittent    Aggravating Factors  moving after being still for greater than 1 hour, prolonged standing    Pain Relieving Factors stretching/meds    Multiple Pain Sites Yes    Pain Score 2    Pain Location Foot    Pain Orientation Right;Left    Pain  Descriptors / Indicators Sharp    Pain Type Acute pain    Pain Onset 1 to 4 weeks ago    Pain Frequency Intermittent    Aggravating Factors  standing/ wearing unsupportive shoes    Pain Relieving Factors rolling foot on frozen water bottle, stretching, proper shoes    Effect of Pain on Daily Activities decreased toleration to walk          TREATMENT   Pt seen for aquatic therapy today. Treatment took place in water 3.25-4.8 ft in depth at the [facility] pool. Temp of water was 91. Pt entered/exited the pool via stairs step to pattern independently with bilat rail. Warm up: forward, backward and side stepping/walking cues for increased step length/knee height, increased speed, hand placement to increase resistance.Used hand paddles for resistence  Stretching: gastroc, hamstringsand sacrospinals in seated position. Deep massage fascia bilat dorsum of feet and manual stretching.Post pelvic tilts and core stabilization ex. Pt able to activate sacrospinals and hold posterior pelvic tilts in sitting, standing and prone.Standing hip extensors and quads completing noodle pulls  Strengthening and balance: Prone cat/camel with manual facilitation. Knees to chest unilateral  then bilateral.Supine lat core activation active and passive. Hip/knee flex/ext resisted by noodle 2x10. Noodle push down 3x10 reps with decreasing ue support of holding to wall then barbells.  Pt requires buoyancy for support and to offload joints with strengthening exercises. Viscosity of the water is needed for resistance of strengthening; water current perturbations provides challenge to standing balance unsupported, requiring increased core activation                               PT Long Term Goals - 07/17/20 2115      PT LONG TERM GOAL #1   Title 5TSTS to 12s    Baseline 15s a eval    Time 6    Period Weeks    Status New    Target Date 08/18/20      PT LONG TERM GOAL  #2   Title BERG to improve by MDC    Baseline see flowsheet    Time 6    Period Weeks    Status New    Target Date 08/18/20      PT LONG TERM GOAL #3   Title bil hip abd to 5/5    Baseline see flowsheet    Time 6    Period Weeks    Status New    Target Date 08/18/20                 Plan - 08/11/20 1158    Clinical Impression Statement Pt with good results/pain relief from prone positoning in pool.  She tolerates positon for increased time demonstrating less apprehsion in position allowing [place] to work on core strength and stretching.  Pt conmpleting HEp as instructed and increasing her amb distance nightly.    Personal Factors and Comorbidities Fitness;Age;Comorbidity 1    Comorbidities osteoporosis    Examination-Activity Limitations Squat;Bed Mobility;Stairs;Lift;Stand;Locomotion Level;Carry;Transfers;Sit    Examination-Participation Restrictions Shop;Meal Prep;Driving    Stability/Clinical Decision Making Stable/Uncomplicated    Clinical Decision Making Moderate    Rehab Potential Good    PT Frequency 2x / week    PT Duration 6 weeks    PT Treatment/Interventions ADLs/Self Care Home Management;Cryotherapy;Aquatic Therapy;Moist Heat;Electrical Stimulation;Functional mobility training;Neuromuscular re-education;Balance training;Therapeutic exercise;Therapeutic activities;Patient/family education;Manual techniques;Passive range of motion;Dry needling;Taping    PT Next Visit Plan advance core stregnthening and dynamic balance    PT Home Exercise Plan to be set in pool    Consulted and Agree with Plan of Care Patient           Patient will benefit from skilled therapeutic intervention in order to improve the following deficits and impairments:  Decreased balance,Difficulty walking,Improper body mechanics,Postural dysfunction,Pain,Decreased strength,Decreased activity tolerance  Visit Diagnosis: Repeated falls  Muscle weakness (generalized)     Problem List Patient  Active Problem List   Diagnosis Date Noted  . After cataract of left eye not obscuring vision 07/26/2014  . Glaucoma suspect, both eyes 08/02/2013  . Conjunctivochalasis 04/07/2012  . Dermatochalasis 04/07/2012  . Dry eyes 04/07/2012  . 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  . Meibomian gland dysfunction 04/07/2012  . Myopia with astigmatism and presbyopia 04/07/2012  . 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  . Posterior vitreous detachment 04/07/2012  . Vitreous syneresis 04/07/2012    Autumn Paul 08/11/2020, 12:08 PM  [facility] [address] [place], [place], [address] Phone: [phone]   Fax:  [phone]  Name: Autumn Paul MRN: [id number] Date of Birth: [birth year]

## 2020-08-18 ENCOUNTER — Ambulatory Visit (HOSPITAL_BASED_OUTPATIENT_CLINIC_OR_DEPARTMENT_OTHER): Payer: Medicare Other | Attending: Internal Medicine | Admitting: Physical Therapy

## 2020-08-18 ENCOUNTER — Other Ambulatory Visit: Payer: Self-pay

## 2020-08-18 DIAGNOSIS — M6281 Muscle weakness (generalized): Secondary | ICD-10-CM | POA: Diagnosis present

## 2020-08-18 DIAGNOSIS — R296 Repeated falls: Secondary | ICD-10-CM | POA: Insufficient documentation

## 2020-08-18 NOTE — Therapy (Signed)
[facility] [address] [place], [place], [address] Phone: [phone]   Fax:  [phone]  Physical Therapy Treatment Progress Note Reporting Period 07/17/20 to 08/18/2020  See note below for Objective Data and Assessment of Progress/Goals.       Patient Details  Name: Autumn Paul MRN: [id number] Date of Birth: June 17, 1945 Referring Provider (PT): Cleatis Polka., MD   Encounter Date: 08/18/2020   PT End of Session - 08/18/20 1026    Visit Number 9    Number of Visits 19    Date for PT Re-Evaluation 09/30/20   starting after next week dt company in town   Authorization Type UHC MCR    Progress Note Due on Visit 19    PT Start Time 1018    PT Stop Time 1056    PT Time Calculation (min) 38 min    Activity Tolerance Patient tolerated treatment well    Behavior During Therapy WFL for tasks assessed/performed           Past Medical History:  Diagnosis Date  . 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 Hyperlipidemia     Past Surgical History:  Procedure Laterality Date  . CARPAL TUNNEL RELEASE     right wrist  . CATARACT EXTRACTION    . COLONOSCOPY    . RHINOPLASTY    . TUBAL LIGATION      There were no vitals filed for this visit.   Subjective Assessment - 08/18/20 1020    Subjective Tomma Lightning has been fantastic. Feel much better after water visits. My plantar fascia has been better. Core exercises in pool has strengthened back. Using tips to get out of bed witout incr pain. Still have AM pain. I have not stumbled or fallen. Still at night getting up to use voltaren on feet, back and knees.    How long can you walk comfortably? about 20 min    Patient Stated Goals decrease pain- wakes at night. improve balance, build muscle    Currently in Pain? No/denies    Pain Location Back    Pain Orientation Mid;Lower    Pain Descriptors / Indicators Sore    Aggravating  Factors  morning pain, stooping/bending    Pain Relieving Factors stretching, aquatics              [facility] PT Assessment - 08/18/20 0001      Assessment   Medical Diagnosis OP & falls    Referring Provider (PT) Cleatis Polka., MD    Onset Date/Surgical Date --   about 1 year     Sensation   Additional Comments burning in feet at night      Strength   Right Hip Flexion 4+/5    Right Hip Extension 5/5   meas in supine d/t stenosis   Right Hip ABduction 5/5    Left Hip Flexion 5/5    Left Hip Extension 5/5   meas in supine d/t stenosis   Left Hip ABduction 4+/5      Special Tests   Other special tests 5TSTS 14s      Berg Balance Test   Sit to Stand Able to stand without using hands and stabilize independently    Standing Unsupported Able to stand safely 2 minutes    Sitting with Back Unsupported but Feet Supported on Floor or Stool Able to sit safely and securely 2 minutes  Stand to Sit Sits safely with minimal use of hands    Transfers Able to transfer safely, minor use of hands    Standing Unsupported with Eyes Closed Able to stand 10 seconds safely    Standing Unsupported with Feet Together Able to place feet together independently and stand 1 minute safely    From Standing, Reach Forward with Outstretched Arm Can reach forward >12 cm safely (5")    From Standing Position, Pick up Object from Floor Able to pick up shoe safely and easily    From Standing Position, Turn to Look Behind Over each Shoulder Looks behind from both sides and weight shifts well    Turn 360 Degrees Able to turn 360 degrees safely in 4 seconds or less    Standing Unsupported, Alternately Place Feet on Step/Stool Able to stand independently and complete 8 steps >20 seconds    Standing Unsupported, One Foot in Front Able to take small step independently and hold 30 seconds    Standing on One Leg Able to lift leg independently and hold 5-10 seconds    Total Score 51                                  PT Education - 08/18/20 1404    Education Details goals- and set new goals, POC, progress    Person(s) Educated Patient    Methods Explanation;Demonstration;Tactile cues;Verbal cues;Handout    Comprehension Verbalized understanding;Returned demonstration;Verbal cues required;Tactile cues required;Need further instruction               PT Long Term Goals - 08/18/20 1028      PT LONG TERM GOAL #1   Title 5TSTS to 12s    Baseline 14s      PT LONG TERM GOAL #2   Title BERG to improve by MDC    Baseline see flowsheet      PT LONG TERM GOAL #3   Title bil hip abd to 5/5      PT LONG TERM GOAL #4   Title able to exercise without incr pain in feet      PT LONG TERM GOAL #5   Title able to ambulate 45 min comfortably                 Plan - 08/18/20 1056    Clinical Impression Statement Pt is making excellent progress with aquatic exercise. Balance and strength scores have all increased and she reports that she can tolerate ambulation for an increased amount of time. At this time she will benefit from further exercise to continue progressing toward her functional goals. Will continue in aquatics for 9 visits until a land re-eval. Provided with basic flexion- bias exercises for back pain at home to do other than aquatics.    PT Frequency 2x / week    PT Duration 6 weeks    PT Treatment/Interventions ADLs/Self Care Home Management;Cryotherapy;Aquatic Therapy;Moist Heat;Electrical Stimulation;Functional mobility training;Neuromuscular re-education;Balance training;Therapeutic exercise;Therapeutic activities;Patient/family education;Manual techniques;Passive range of motion;Dry needling;Taping    PT Next Visit Plan cont with aquatics progressions    PT Home Exercise Plan [id number], pool exercises    Consulted and Agree with Plan of Care Patient           Patient will benefit from skilled therapeutic intervention in order to  improve the following deficits and impairments:  Decreased balance,Difficulty walking,Improper body mechanics,Postural dysfunction,Pain,Decreased strength,Decreased activity tolerance  Visit Diagnosis:  Repeated falls - Plan: PT plan of care cert/re-cert  Muscle weakness (generalized) - Plan: PT plan of care cert/re-cert     Problem List Patient Active Problem List   Diagnosis Date Noted  . After cataract of left eye not obscuring vision 07/26/2014  . Glaucoma suspect, both eyes 08/02/2013  . Conjunctivochalasis 04/07/2012  . Dermatochalasis 04/07/2012  . Dry eyes 04/07/2012  . 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  . Meibomian gland dysfunction 04/07/2012  . Myopia with astigmatism and presbyopia 04/07/2012  . 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  . Posterior vitreous detachment 04/07/2012  . Vitreous syneresis 04/07/2012    Alena Blankenbeckler C. Shekera Beavers PT, DPT 08/18/20 2:08 PM   [facility] [address] [place], [place], [address] Phone: [phone]   Fax:  [phone]  Name: Teale Goodgame Flott MRN: [id number] Date of Birth: 1945/05/14

## 2020-08-29 ENCOUNTER — Other Ambulatory Visit: Payer: Self-pay

## 2020-08-29 ENCOUNTER — Encounter (HOSPITAL_BASED_OUTPATIENT_CLINIC_OR_DEPARTMENT_OTHER): Payer: Self-pay | Admitting: Physical Therapy

## 2020-08-29 ENCOUNTER — Ambulatory Visit (HOSPITAL_BASED_OUTPATIENT_CLINIC_OR_DEPARTMENT_OTHER): Payer: Medicare Other | Admitting: Physical Therapy

## 2020-08-29 DIAGNOSIS — R296 Repeated falls: Secondary | ICD-10-CM

## 2020-08-29 DIAGNOSIS — M6281 Muscle weakness (generalized): Secondary | ICD-10-CM

## 2020-08-29 NOTE — Therapy (Addendum)
[facility] [address] 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  Subjective: I think I went a little backwards. Dtr was in town, didn't keep up with my exercises and went on a hike to see the waterfalls. Feet and back are hurting. Pain 2/10 foot Heels, plantar aspect to midfoot Tender Acute pain Aggrivating: morning, with initial movement/weightbearing Relieving: stretching/aquatics, rolling on ice water bottle Pain 2/10  back Aching Aggravating: standing/walking >20 min Relieving: rest, aquatics/stretching   Pt seen for aquatic therapy today.  Treatment took place in water 3.25-4.8 ft in depth at the [facility] pool. Temp of water was 91.  Pt entered/exited the pool via stairs step to pattern independently with bilat rail. Warm up: forward, backward and side stepping/walking cues for increased step length/knee height, increased speed, hand placement to increase resistance. Used hand paddles for resistence   Stretching: gastroc, hamstrings and sacrospinals in seated position. Deep massage fascia bilat dorsum of feet and manual stretching. Post pelvic tilts and core stabilization ex. Hip hinges 2 x 10 reps Standing hip  extensors and quads completing noodle pulls   Strengthening and balance: Superman stretch 2x5 knees to chest assisted by therapist, Ree Kida knife 2 x 4 reps. Hip/knee flex/ext resisted by noodle 2x10. Noodle push down 3x10 reps holding to single barbell progressed to without.  Tandem stand with barbell support x 20 secs pt falling out of position multiple times. Kickboard push downs SLS 2 x 10 reps, then completed feet together.                              PT Long Term Goals - 08/18/20 1028       PT LONG TERM GOAL #1   Title 5TSTS to 12s    Baseline 14s      PT LONG TERM GOAL #2   Title BERG to improve by MDC    Baseline see flowsheet      PT LONG TERM GOAL #3   Title bil hip abd to 5/5      PT LONG TERM GOAL #4   Title able to exercise without incr pain in feet      PT LONG TERM GOAL #5   Title able to ambulate 45 min comfortably             Plan Pt compliant with added exercises to HEP from land visit. Concentration today on core strength and balance. Progression with noodle kick downs completing without ue support and kick board pushdowns completing in SLS. Demonstrating improved core strength and balance ability  HEP  [id number], pool exercises     Patient  will benefit from skilled therapeutic intervention in order to improve the following deficits and impairments:  Decreased balance, Difficulty walking, Improper body mechanics, Postural dysfunction, Pain, Decreased strength, Decreased activity tolerance  Visit Diagnosis: Muscle weakness (generalized)  Repeated falls     Problem List Patient Active Problem List   Diagnosis Date Noted   After cataract of left eye not obscuring vision 07/26/2014   Glaucoma suspect, both eyes 08/02/2013   Conjunctivochalasis 04/07/2012   Dermatochalasis 04/07/2012   Dry eyes 04/07/2012   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   Meibomian gland dysfunction 04/07/2012   Myopia with astigmatism and presbyopia  04/07/2012   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   Posterior vitreous detachment 04/07/2012   Vitreous syneresis 04/07/2012    Army Fossa MPT 09/12/2020, 2:20 PM  [facility] [address] [place], [place], [address] Phone: [phone]   Fax:  [phone]  Name: Autumn Paul MRN: [id number] Date of Birth: 28-Sep-1945   Addendum by Gwenlyn Found. Hightower PT, DPT 09/12/20 2:24 PM

## 2020-09-01 ENCOUNTER — Encounter (HOSPITAL_BASED_OUTPATIENT_CLINIC_OR_DEPARTMENT_OTHER): Payer: Self-pay | Admitting: Physical Therapy

## 2020-09-01 ENCOUNTER — Ambulatory Visit (HOSPITAL_BASED_OUTPATIENT_CLINIC_OR_DEPARTMENT_OTHER): Payer: Medicare Other | Admitting: Physical Therapy

## 2020-09-05 ENCOUNTER — Ambulatory Visit (HOSPITAL_BASED_OUTPATIENT_CLINIC_OR_DEPARTMENT_OTHER): Payer: Medicare Other | Admitting: Physical Therapy

## 2020-09-07 ENCOUNTER — Ambulatory Visit (HOSPITAL_BASED_OUTPATIENT_CLINIC_OR_DEPARTMENT_OTHER): Payer: Medicare Other | Admitting: Physical Therapy

## 2020-09-12 ENCOUNTER — Ambulatory Visit (HOSPITAL_BASED_OUTPATIENT_CLINIC_OR_DEPARTMENT_OTHER): Payer: Medicare Other | Admitting: Physical Therapy

## 2020-09-14 ENCOUNTER — Ambulatory Visit (HOSPITAL_BASED_OUTPATIENT_CLINIC_OR_DEPARTMENT_OTHER): Payer: Medicare Other | Admitting: Physical Therapy

## 2020-09-19 ENCOUNTER — Ambulatory Visit: Payer: Medicare Other | Admitting: Podiatry

## 2020-09-19 ENCOUNTER — Ambulatory Visit (HOSPITAL_BASED_OUTPATIENT_CLINIC_OR_DEPARTMENT_OTHER): Payer: Medicare Other | Admitting: Physical Therapy

## 2020-09-21 ENCOUNTER — Encounter (HOSPITAL_BASED_OUTPATIENT_CLINIC_OR_DEPARTMENT_OTHER): Payer: Self-pay | Admitting: Physical Therapy

## 2020-09-21 ENCOUNTER — Ambulatory Visit (HOSPITAL_BASED_OUTPATIENT_CLINIC_OR_DEPARTMENT_OTHER): Payer: Medicare Other | Attending: Internal Medicine | Admitting: Physical Therapy

## 2020-09-21 ENCOUNTER — Other Ambulatory Visit: Payer: Self-pay

## 2020-09-21 DIAGNOSIS — R296 Repeated falls: Secondary | ICD-10-CM | POA: Diagnosis present

## 2020-09-21 DIAGNOSIS — M6281 Muscle weakness (generalized): Secondary | ICD-10-CM | POA: Diagnosis present

## 2020-09-21 NOTE — Therapy (Signed)
[facility] [address] [place], [place], [address] Phone: [phone]   Fax:  [phone]  Physical Therapy Treatment  Patient Details  Name: Autumn Paul MRN: [id number] Date of Birth: December 09, 1945 Referring Provider (PT): Cleatis Polka., MD   Encounter Date: 09/21/2020   PT End of Session - 09/21/20 0901     Visit Number 11    Number of Visits 19    Date for PT Re-Evaluation 09/30/20    Authorization Type UHC MCR    PT Start Time 0902    PT Stop Time 0945    PT Time Calculation (min) 43 min    Equipment Utilized During Treatment Other (comment)    Activity Tolerance Patient tolerated treatment well    Behavior During Therapy WFL for tasks assessed/performed             Past Medical History:  Diagnosis Date   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   Subjective Assessment - 09/21/20 1045     Subjective Cancelled some appoints because I got cortizone shots in my knees.  Have felt really good until yesterday when my back started hurting bad.  Almost cancelled today.    Limitations Standing    Patient Stated Goals decrease pain- wakes at night. improve balance, build muscle    Pain Score 0-No pain    Multiple Pain Sites Yes    Pain Score 7    Pain Orientation Left    Pain Descriptors / Indicators Aching    Pain Type Acute pain    Pain Onset Yesterday    Pain Frequency Intermittent    Aggravating Factors  standing and walking    Effect of Pain on Daily Activities difficulty with all movement             Pt seen for aquatic therapy today.  Treatment took place in water 3.25-4.8 ft in depth at the [facility]  pool. Temp of water was 91.  Pt entered/exited the pool via stairs step to pattern independently with bilat rail.  Warm up: forward, backward and side stepping/walking cues for increased step length/knee height, increased speed, hand placement to increase resistance.    Stretching: gastroc, hamstrings and sacrospinals in seated and standing position tactile assist by therapist. Water massage In Jacuzzi, manual deep massage and assessment of SI joint.    SI mobilization suspended in supine.  Warm down Water walking x 6 widths of pool cuing for high knee marching.   Pt requires buoyancy for support and to offload joints with strengthening exercises. Viscosity of the water is needed for resistance of strengthening; water current perturbations provides challenge to standing balance unsupported, requiring increased core activation.                            PT Long Term Goals - 08/18/20 1028       PT LONG TERM GOAL #1   Title 5TSTS to 12s    Baseline 14s      PT LONG TERM GOAL #2   Title BERG  to improve by MDC    Baseline see flowsheet      PT LONG TERM GOAL #3   Title bil hip abd to 5/5      PT LONG TERM GOAL #4   Title able to exercise without incr pain in feet      PT LONG TERM GOAL #5   Title able to ambulate 45 min comfortably                   Plan - 09/21/20 1049     Clinical Impression Statement Pt with increased LBP maybe associated to SI in nature. Focus on stretching and mobilizing SI joint in therapy pool and jacuzzi using the jets for water massage while stretching LB. Pt reports decrease in discomfort upon completion.  Pt reporting no knee pain after injections.    Personal Factors and Comorbidities Fitness;Age;Comorbidity 1    Stability/Clinical Decision Making Stable/Uncomplicated    PT Treatment/Interventions ADLs/Self Care Home Management;Cryotherapy;Aquatic Therapy;Moist Heat;Electrical Stimulation;Functional mobility  training;Neuromuscular re-education;Balance training;Therapeutic exercise;Therapeutic activities;Patient/family education;Manual techniques;Passive range of motion;Dry needling;Taping             Patient will benefit from skilled therapeutic intervention in order to improve the following deficits and impairments:  Decreased balance, Difficulty walking, Improper body mechanics, Postural dysfunction, Pain, Decreased strength, Decreased activity tolerance  Visit Diagnosis: Muscle weakness (generalized)  Repeated falls     Problem List Patient Active Problem List   Diagnosis Date Noted   After cataract of left eye not obscuring vision 07/26/2014   Glaucoma suspect, both eyes 08/02/2013   Conjunctivochalasis 04/07/2012   Dermatochalasis 04/07/2012   Dry eyes 04/07/2012   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   Meibomian gland dysfunction 04/07/2012   Myopia with astigmatism and presbyopia 04/07/2012   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   Posterior vitreous detachment 04/07/2012   Vitreous syneresis 04/07/2012    Autumn Paul  MPT 09/21/2020, 11:09 AM  [facility] [address] [place], [place], [address] Phone: [phone]   Fax:  [phone]  Name: Autumn Paul MRN: [id number] Date of Birth: [birth year]

## 2020-09-26 ENCOUNTER — Other Ambulatory Visit: Payer: Self-pay

## 2020-09-26 ENCOUNTER — Ambulatory Visit (HOSPITAL_BASED_OUTPATIENT_CLINIC_OR_DEPARTMENT_OTHER): Payer: Medicare Other | Admitting: Physical Therapy

## 2020-09-26 ENCOUNTER — Encounter (HOSPITAL_BASED_OUTPATIENT_CLINIC_OR_DEPARTMENT_OTHER): Payer: Self-pay | Admitting: Physical Therapy

## 2020-09-26 DIAGNOSIS — R296 Repeated falls: Secondary | ICD-10-CM

## 2020-09-26 DIAGNOSIS — M6281 Muscle weakness (generalized): Secondary | ICD-10-CM

## 2020-09-26 NOTE — Therapy (Signed)
[facility] [address] [place], [place], [address] Phone: [phone]   Fax:  [phone]  Physical Therapy Treatment  Patient Details  Name: Autumn Paul MRN: [id number] Date of Birth: Aug 19, 1945 Referring Provider (PT): Cleatis Polka., MD   Encounter Date: 09/26/2020   PT End of Session - 09/26/20 0908     Visit Number 12    Number of Visits 19    Date for PT Re-Evaluation 09/30/20    Authorization Type UHC MCR    Progress Note Due on Visit 19    PT Start Time 0905    PT Stop Time 0947    PT Time Calculation (min) 42 min    Equipment Utilized During Treatment Other (comment)    Activity Tolerance Patient tolerated treatment well    Behavior During Therapy WFL for tasks assessed/performed             Past Medical History:  Diagnosis Date   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   Subjective Assessment - 09/26/20 1028     Subjective "Feeling much better, LBP improved, just has a short 2-3 day set back."    Currently in Pain? No/denies    Pain Score 4    Pain Location Back    Pain Orientation Left    Pain Descriptors / Indicators Aching    Pain Type Chronic pain    Aggravating Factors  standing    Pain Relieving Factors rest and aquatics    Effect of Pain on Daily Activities decreased ability to amb distances greater than 30 mins             Pt seen for aquatic therapy today.  Treatment took place in water 3.25-4.8 ft in depth at the [facility] pool. Temp of water was 91.  Pt entered/exited the pool via stairs step to pattern independently with bilat rail.   Warm up: forward, backward and side  stepping/walking cues for increased step length/knee height, increased speed, hand placement to increase resistance.    Stretching: gastroc, hamstrings and sacrospinals in seated and standing position tactile assist by therapist.    Standing Noodle kick down 3 x 10 reps holding bilat hand buoys decreasing to 1 hand buoy; hip flex and HF with ER Hip flexor stretch facing wall with noodle then stre noodle pull through x 10. Add/abd 2 x 10  Cuing throughout for contracted abdominals and glutes for core stabilization. Tactile cues as well as demonstration provided. Water walking forward and back x 4 widths in between each exercise. Cuing for increased speed and step length   Balance challenges standing and rotating on noodle in df then plantar flex, holding each position initially x 30 secs then PF rotating to DF, gaining balance prior to rotating again.  After several tries pt able to hold each position x 30 seconds.  Best rotating trial x 3 movements before LOB.  Cuing for tightened core musculature for strengthening and balance.     Pt requires buoyancy for support and to offload joints with strengthening exercises. Viscosity of the water is needed for  resistance of strengthening; water current perturbations provides challenge to standing balance unsupported, requiring increased core activation.                                 PT Long Term Goals - 08/18/20 1028       PT LONG TERM GOAL #1   Title 5TSTS to 12s    Baseline 14s      PT LONG TERM GOAL #2   Title BERG to improve by MDC    Baseline see flowsheet      PT LONG TERM GOAL #3   Title bil hip abd to 5/5      PT LONG TERM GOAL #4   Title able to exercise without incr pain in feet      PT LONG TERM GOAL #5   Title able to ambulate 45 min comfortably                   Plan - 09/26/20 1031     Clinical Impression Statement Pt dmeonstrating improvement with balance as evidenced by little to  no LOB with water walking in all directions with and without ankle buoy cuffs.  She is tolerating high balance level challenges and increased water walking/aerobic capacity training.    Personal Factors and Comorbidities Fitness;Age;Comorbidity 1    Comorbidities osteoporosis    Stability/Clinical Decision Making Stable/Uncomplicated    Clinical Decision Making Moderate    Rehab Potential Good    PT Frequency 2x / week    PT Duration 6 weeks    PT Treatment/Interventions ADLs/Self Care Home Management;Cryotherapy;Aquatic Therapy;Moist Heat;Electrical Stimulation;Functional mobility training;Neuromuscular re-education;Balance training;Therapeutic exercise;Therapeutic activities;Patient/family education;Manual techniques;Passive range of motion;Dry needling;Taping    PT Next Visit Plan TUG and noodle kicj down testing    PT Home Exercise Plan [id number], pool exercises    Consulted and Agree with Plan of Care Patient             Patient will benefit from skilled therapeutic intervention in order to improve the following deficits and impairments:  Decreased balance, Difficulty walking, Improper body mechanics, Postural dysfunction, Pain, Decreased strength, Decreased activity tolerance  Visit Diagnosis: Muscle weakness (generalized)  Repeated falls     Problem List Patient Active Problem List   Diagnosis Date Noted   After cataract of left eye not obscuring vision 07/26/2014   Glaucoma suspect, both eyes 08/02/2013   Conjunctivochalasis 04/07/2012   Dermatochalasis 04/07/2012   Dry eyes 04/07/2012   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   Meibomian gland dysfunction 04/07/2012   Myopia with astigmatism and presbyopia 04/07/2012   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

## 2020-09-27 ENCOUNTER — Encounter (HOSPITAL_BASED_OUTPATIENT_CLINIC_OR_DEPARTMENT_OTHER): Payer: Self-pay | Admitting: Physical Therapy

## 2020-09-28 ENCOUNTER — Other Ambulatory Visit: Payer: Self-pay

## 2020-09-28 ENCOUNTER — Ambulatory Visit (HOSPITAL_BASED_OUTPATIENT_CLINIC_OR_DEPARTMENT_OTHER): Payer: Medicare Other | Admitting: Physical Therapy

## 2020-09-28 ENCOUNTER — Encounter (HOSPITAL_BASED_OUTPATIENT_CLINIC_OR_DEPARTMENT_OTHER): Payer: Self-pay | Admitting: Physical Therapy

## 2020-09-28 DIAGNOSIS — M6281 Muscle weakness (generalized): Secondary | ICD-10-CM | POA: Diagnosis not present

## 2020-09-28 DIAGNOSIS — R296 Repeated falls: Secondary | ICD-10-CM

## 2020-09-29 ENCOUNTER — Encounter (HOSPITAL_BASED_OUTPATIENT_CLINIC_OR_DEPARTMENT_OTHER): Payer: Self-pay | Admitting: Physical Therapy

## 2020-09-29 NOTE — Therapy (Signed)
[facility] [address] [place], [place], [address] Phone: [phone]   Fax:  [phone]  Physical Therapy Treatment  Patient Details  Name: Autumn Paul Mitchelle MRN: [id number] Date of Birth: 1946-02-14 Referring Provider (PT): Cleatis Polka., MD   Encounter Date: 09/28/2020   PT End of Session - 09/29/20 2101     Visit Number 13    Number of Visits 25    Date for PT Re-Evaluation 11/10/20    Authorization Type UHC MCR    Progress Note Due on Visit 19    PT Start Time 0932    PT Stop Time 1012    PT Time Calculation (min) 40 min    Activity Tolerance Patient tolerated treatment well    Behavior During Therapy WFL for tasks assessed/performed             Past Medical History:  Diagnosis Date   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   Subjective Assessment - 09/29/20 2101     Subjective My daughter came for a week and had a bad situation where knees and foot started hurting me- cortisone injections in both knees helped with back pain. I have a lot of trouble with my balance- water is helpful. I think I could strengthen my core some more. I have been walking daily until the knee issues. I was walking 30-35 min each evening without issues with feet, plantar fascia has imprved.    How long can you walk comfortably? 30-35 min    Patient Stated Goals decrease pain- wakes at night. improve balance, build muscle, upper body in gym    Currently in Pain? No/denies    Aggravating Factors  not sure what brought on pain    Pain Relieving Factors stretching, aquatics, walking                [facility] PT Assessment - 09/29/20 0001        Assessment   Medical Diagnosis OP & falls    Referring Provider (PT) Cleatis Polka., MD    Onset Date/Surgical Date --   about 1 year     Precautions   Precautions Fall      Balance Screen   Has the patient fallen in the past 6 months Yes    How many times? 1    Has the patient had a decrease in activity level because of a fear of falling?  Yes    Is the patient reluctant to leave their home because of a fear of falling?  No      Home Nurse, mental health Private residence    Living Arrangements Spouse/significant other    Additional Comments stairs but not frequently used      Strength   Right Hip Flexion 4+/5    Right Hip ABduction 5/5    Left Hip Flexion 4-/5    Left Hip ABduction 5/5      Special Tests   Other special tests 13s      Berg Balance Test   Sit to Stand Able to stand without  using hands and stabilize independently    Standing Unsupported Able to stand safely 2 minutes    Sitting with Back Unsupported but Feet Supported on Floor or Stool Able to sit safely and securely 2 minutes    Stand to Sit Sits safely with minimal use of hands    Transfers Able to transfer safely, minor use of hands    Standing Unsupported with Eyes Closed Able to stand 10 seconds safely    Standing Unsupported with Feet Together Able to place feet together independently and stand 1 minute safely    From Standing, Reach Forward with Outstretched Arm Can reach confidently >25 cm (10")    From Standing Position, Pick up Object from Floor Able to pick up shoe safely and easily    From Standing Position, Turn to Look Behind Over each Shoulder Looks behind from both sides and weight shifts well    Turn 360 Degrees Able to turn 360 degrees safely in 4 seconds or less    Standing Unsupported, Alternately Place Feet on Step/Stool Able to stand independently and complete 8 steps >20 seconds    Standing Unsupported, One Foot in Front Able to plae foot ahead of the other  independently and hold 30 seconds    Standing on One Leg Able to lift leg independently and hold equal to or more than 3 seconds    Total Score 52                           [facility] Adult PT Treatment/Exercise - 09/29/20 0001       Knee/Hip Exercises: Seated   Other Seated Knee/Hip Exercises on physioball- pelvic tilts, clocks, bouncing    Other Seated Knee/Hip Exercises seated pelvic tilt/resting postrue      Knee/Hip Exercises: Supine   Other Supine Knee/Hip Exercises ab set with ball bw knees, cues for rib flare reduction- prog to TT told                    PT Education - 09/29/20 2055     Education Details objective measures, goals, POC    Person(s) Educated Patient    Methods Explanation    Comprehension Verbalized understanding;Need further instruction                 PT Long Term Goals - 09/28/20 0941       PT LONG TERM GOAL #1   Title 5TSTS to 12s    Baseline 13s    Status On-going      PT LONG TERM GOAL #2   Title BERG to improve by MDC    Baseline see flowsheet    Status Achieved      PT LONG TERM GOAL #3   Title bil hip abd to 5/5    Baseline see flowsheet    Status On-going      PT LONG TERM GOAL #4   Title able to exercise without incr pain in feet    Status Achieved      PT LONG TERM GOAL #5   Title able to ambulate 45 min comfortably- progressing goal to 60 minutes today    Baseline has done 45 comfortably    Status On-going                   Plan - 09/29/20 2102     Clinical Impression Statement Mild post rotation of Lt innom corrected with single 5x5 Lt isometric  hip flexion. Overall balance has improved but cont to require cues for psoture, improvement in endurance noted but would benefit from advancing for functional ambulation. Will cont with aquatic PT for strengthening with reduced pressure through knee joints and then progress back to land.    PT Frequency 2x / week    PT Duration 6 weeks    PT  Treatment/Interventions ADLs/Self Care Home Management;Cryotherapy;Aquatic Therapy;Moist Heat;Electrical Stimulation;Functional mobility training;Neuromuscular re-education;Balance training;Therapeutic exercise;Therapeutic activities;Patient/family education;Manual techniques;Passive range of motion;Dry needling;Taping    PT Next Visit Plan TUG and noodle kicj down testing    PT Home Exercise Plan [id number], pool exercises, sitting/standing posture with core vs lumbar    Consulted and Agree with Plan of Care Patient             Patient will benefit from skilled therapeutic intervention in order to improve the following deficits and impairments:  Decreased balance, Difficulty walking, Improper body mechanics, Postural dysfunction, Pain, Decreased strength, Decreased activity tolerance  Visit Diagnosis: Muscle weakness (generalized) - Plan: PT plan of care cert/re-cert  Repeated falls - Plan: PT plan of care cert/re-cert     Problem List Patient Active Problem List   Diagnosis Date Noted   After cataract of left eye not obscuring vision 07/26/2014   Glaucoma suspect, both eyes 08/02/2013   Conjunctivochalasis 04/07/2012   Dermatochalasis 04/07/2012   Dry eyes 04/07/2012   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   Meibomian gland dysfunction 04/07/2012   Myopia with astigmatism and presbyopia 04/07/2012   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 Loneta Tamplin PT, DPT 09/29/20 9:03 PM   [facility] [address] [place], [place], [address] Phone: [phone]   Fax:  [phone]  Name: Mykira Hofmeister Coupe MRN: [id number] Date of Birth: Nov 26, 1945

## 2020-10-12 ENCOUNTER — Other Ambulatory Visit: Payer: Self-pay

## 2020-10-12 ENCOUNTER — Encounter (HOSPITAL_BASED_OUTPATIENT_CLINIC_OR_DEPARTMENT_OTHER): Payer: Self-pay | Admitting: Physical Therapy

## 2020-10-12 ENCOUNTER — Ambulatory Visit (HOSPITAL_BASED_OUTPATIENT_CLINIC_OR_DEPARTMENT_OTHER): Payer: Medicare Other | Admitting: Physical Therapy

## 2020-10-12 DIAGNOSIS — M6281 Muscle weakness (generalized): Secondary | ICD-10-CM

## 2020-10-12 DIAGNOSIS — R296 Repeated falls: Secondary | ICD-10-CM

## 2020-10-12 NOTE — Therapy (Signed)
[facility] [address] [place], [place], [address] Phone: [phone]   Fax:  [phone]  Physical Therapy Treatment  Patient Details  Name: Autumn Paul MRN: [id number] Date of Birth: March 01, 1946 Referring Provider (PT): Cleatis Polka., MD   Encounter Date: 10/12/2020   PT End of Session - 10/12/20 0922     Visit Number 14    Number of Visits 25    Date for PT Re-Evaluation 11/10/20    Authorization Type UHC MCR    Progress Note Due on Visit 19    PT Start Time 0845    PT Stop Time 0928    PT Time Calculation (min) 43 min    Activity Tolerance Patient tolerated treatment well    Behavior During Therapy WFL for tasks assessed/performed             Past Medical History:  Diagnosis Date   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   Subjective Assessment - 10/12/20 0848     Subjective I think I overdid it on the calf presss machine but that is it.    Currently in Pain? No/denies              AquaticREHABdocumentation: Water will aid with movement using the current and laminar flow while the buoyancy reduces weight bearing, Pt requires the buoyancy of water for active assisted exercises with buoyancy supported for strengthening & ROM exercises, Pt.requires the viscosity of the water for resistance with strengthening exercises, and Water current provides perturbations which challenge standing balance unsupported   Fwd walking Backward walking focus on toe to heel down center of foot Gastroc lunge stretch Noodle under ankle with knee flexed- hip ext presses Attempted opp UE/LE punch/march- held for sequencing  difficulties Small squat straight arm press down with kick board Kick board push/pull Straddle noodle- static balance holds, bicycles, hip abd/add Hip hinge with reach fwd holding 2 water dumbbells Tandem balance & SLS- arms over chest- encouraging to use LE to correct LOB vs UE.                             PT Long Term Goals - 09/28/20 0941       PT LONG TERM GOAL #1   Title 5TSTS to 12s    Baseline 13s    Status On-going      PT LONG TERM GOAL #2   Title BERG to improve by MDC    Baseline see flowsheet    Status Achieved      PT LONG TERM GOAL #3   Title bil hip abd to 5/5    Baseline see flowsheet    Status On-going      PT LONG TERM GOAL #4   Title able to exercise without incr pain in feet    Status Achieved      PT LONG TERM GOAL #5   Title able to ambulate 45 min comfortably- progressing goal to 60 minutes today    Baseline has done 45 comfortably    Status On-going  Plan - 10/12/20 0939     Clinical Impression Statement Continued to incr balance challenges and encourage use of LEs to correct rather than UEs. difficulty with sequencing in opp UE/LE motions    PT Treatment/Interventions ADLs/Self Care Home Management;Cryotherapy;Aquatic Therapy;Moist Heat;Electrical Stimulation;Functional mobility training;Neuromuscular re-education;Balance training;Therapeutic exercise;Therapeutic activities;Patient/family education;Manual techniques;Passive range of motion;Dry needling;Taping    PT Next Visit Plan TUG across pool, revisit opp UE/LE    PT Home Exercise Plan [id number], pool exercises, sitting/standing posture with core vs lumbar    Consulted and Agree with Plan of Care Patient             Patient will benefit from skilled therapeutic intervention in order to improve the following deficits and impairments:  Decreased balance, Difficulty walking, Improper body mechanics, Postural dysfunction, Pain, Decreased  strength, Decreased activity tolerance  Visit Diagnosis: Muscle weakness (generalized)  Repeated falls     Problem List Patient Active Problem List   Diagnosis Date Noted   After cataract of left eye not obscuring vision 07/26/2014   Glaucoma suspect, both eyes 08/02/2013   Conjunctivochalasis 04/07/2012   Dermatochalasis 04/07/2012   Dry eyes 04/07/2012   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   Meibomian gland dysfunction 04/07/2012   Myopia with astigmatism and presbyopia 04/07/2012   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 Priti Consoli PT, DPT 10/12/20 9:42 AM   [facility] [address] [place], [place], [address] Phone: [phone]   Fax:  [phone]  Name: Autumn Paul MRN: [id number] Date of Birth: 1946-03-13

## 2020-10-13 ENCOUNTER — Encounter (HOSPITAL_BASED_OUTPATIENT_CLINIC_OR_DEPARTMENT_OTHER): Payer: Self-pay | Admitting: Physical Therapy

## 2020-10-13 ENCOUNTER — Ambulatory Visit (HOSPITAL_BASED_OUTPATIENT_CLINIC_OR_DEPARTMENT_OTHER): Payer: Medicare Other | Admitting: Physical Therapy

## 2020-10-13 DIAGNOSIS — M6281 Muscle weakness (generalized): Secondary | ICD-10-CM

## 2020-10-13 DIAGNOSIS — R296 Repeated falls: Secondary | ICD-10-CM

## 2020-10-13 NOTE — Therapy (Signed)
[facility] [address] [place], [place], [address] Phone: [phone]   Fax:  [phone]  Physical Therapy Treatment  Patient Details  Name: Justus Duerr Hiltner MRN: [id number] Date of Birth: [birth year] Referring Provider (PT): Cleatis Polka., MD   Encounter Date: 10/13/2020   PT End of Session - 10/13/20 0806     Visit Number 15    Number of Visits 25    Date for PT Re-Evaluation 11/10/20    Authorization Type UHC MCR    Progress Note Due on Visit 19    PT Start Time 0805    PT Stop Time 0844    PT Time Calculation (min) 39 min    Activity Tolerance Patient tolerated treatment well    Behavior During Therapy WFL for tasks assessed/performed             Past Medical History:  Diagnosis Date   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   Subjective Assessment - 10/13/20 0807     Subjective Just a little sore in my buttocks                [facility] PT Assessment - 10/13/20 0001       Special Tests   Other special tests 9s across pool no arms in water                 AquaticREHABdocumentation: Pt.requires the viscosity of the water for resistance with strengthening exercises and Water current provides perturbations which challenge standing balance unsupported Walking for warm up Tandem arms across chest SLS arms across chest SLS with LE circles- water dumbbells floating for support *all balance done at 3'6" Lunge triceps press down- cues for LE alignment Tandem on large square noodle- pt independent set up with assist single hand on side of pool- able to hold 4s with Rt foot  fwd                       PT Long Term Goals - 09/28/20 0941       PT LONG TERM GOAL #1   Title 5TSTS to 12s    Baseline 13s    Status On-going      PT LONG TERM GOAL #2   Title BERG to improve by MDC    Baseline see flowsheet    Status Achieved      PT LONG TERM GOAL #3   Title bil hip abd to 5/5    Baseline see flowsheet    Status On-going      PT LONG TERM GOAL #4   Title able to exercise without incr pain in feet    Status Achieved      PT LONG TERM GOAL #5   Title able to ambulate 45 min comfortably- progressing goal to 60 minutes today    Baseline has done 45 comfortably    Status On-going                   Plan - 10/13/20 0849     Clinical Impression Statement Pt demo improved  LE power in decr time it takes to walk across pool without assist from UEs. was willing to try to lift her hand off of side of pool for balance on noodle. Still has a fear of water but is considering lessons.    PT Treatment/Interventions ADLs/Self Care Home Management;Cryotherapy;Aquatic Therapy;Moist Heat;Electrical Stimulation;Functional mobility training;Neuromuscular re-education;Balance training;Therapeutic exercise;Therapeutic activities;Patient/family education;Manual techniques;Passive range of motion;Dry needling;Taping    PT Next Visit Plan cont balance with core, add EO/EC and head turns    PT Home Exercise Plan [email or web address], pool exercises, sitting/standing posture with core vs lumbar    Consulted and Agree with Plan of Care Patient             Patient will benefit from skilled therapeutic intervention in order to improve the following deficits and impairments:  Decreased balance, Difficulty walking, Improper body mechanics, Postural dysfunction, Pain, Decreased strength, Decreased activity tolerance  Visit Diagnosis: Muscle weakness (generalized)  Repeated falls     Problem List Patient Active Problem List   Diagnosis Date Noted   After  cataract of left eye not obscuring vision 07/26/2014   Glaucoma suspect, both eyes 08/02/2013   Conjunctivochalasis 04/07/2012   Dermatochalasis 04/07/2012   Dry eyes 04/07/2012   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   Meibomian gland dysfunction 04/07/2012   Myopia with astigmatism and presbyopia 04/07/2012   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   Posterior vitreous detachment 04/07/2012   Vitreous syneresis 04/07/2012   Jenevieve Kirschbaum C. Breland Trouten PT, DPT 10/13/20 9:31 AM   [facility] [address] [place], [place], [address] Phone: [phone]   Fax:  [phone]  Name: Niyla Marone Reichel MRN: [id number] Date of Birth: 27-Jun-1945

## 2020-10-18 ENCOUNTER — Other Ambulatory Visit: Payer: Self-pay

## 2020-10-18 ENCOUNTER — Ambulatory Visit (HOSPITAL_BASED_OUTPATIENT_CLINIC_OR_DEPARTMENT_OTHER): Payer: Medicare Other | Attending: Internal Medicine | Admitting: Physical Therapy

## 2020-10-18 DIAGNOSIS — R296 Repeated falls: Secondary | ICD-10-CM | POA: Insufficient documentation

## 2020-10-18 DIAGNOSIS — M6281 Muscle weakness (generalized): Secondary | ICD-10-CM

## 2020-10-18 NOTE — Therapy (Signed)
[facility] [address] [place], [place], [address] Phone: [phone]   Fax:  [phone]  Physical Therapy Treatment  Patient Details  Name: Dakisha Schoof Knighton MRN: [id number] Date of Birth: 1945/03/21 Referring Provider (PT): Cleatis Polka., MD   Encounter Date: 10/18/2020   PT End of Session - 10/18/20 1301     Visit Number 16    Number of Visits 25    Date for PT Re-Evaluation 11/10/20    Authorization Type UHC MCR    Progress Note Due on Visit 19    PT Start Time 1300    PT Stop Time 1340    PT Time Calculation (min) 40 min    Activity Tolerance Patient tolerated treatment well    Behavior During Therapy WFL for tasks assessed/performed             Past Medical History:  Diagnosis Date   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   Subjective Assessment - 10/18/20 1300     Subjective Feeling pretty good today.    Currently in Pain? No/denies               AquaticREHABdocumentation: Water will allow for reduced gait deviation due to reduced joint loading through buoyancy to help patient improve posture without excess stress and pain.  and Water will allow for work on balance using up thrust to improve posture. The principles of viscosity will help slow movement allowing for better processing time during fall recovery practice Water walking fwd/back alt UE with yellow water dumbbells Side stepping & UE abd/add yellow dumbbells Tandem with UEs on noodle- static, head turns Walking across pool with head turned- significant instability with head turned Lt- h/o vertigo s/s consistent with BPPV Csit on bench- LE abd/add,  bicycles Seated knee flex/ext with water resist                             PT Long Term Goals - 09/28/20 0941       PT LONG TERM GOAL #1   Title 5TSTS to 12s    Baseline 13s    Status On-going      PT LONG TERM GOAL #2   Title BERG to improve by MDC    Baseline see flowsheet    Status Achieved      PT LONG TERM GOAL #3   Title bil hip abd to 5/5    Baseline see flowsheet    Status On-going      PT LONG TERM GOAL #4   Title able to exercise without incr pain in feet    Status Achieved      PT LONG TERM GOAL #5   Title able to ambulate 45 min comfortably- progressing goal to 60 minutes today    Baseline has done 45 comfortably    Status On-going                   Plan - 10/18/20 1350     Clinical Impression Statement Notable difficulty maintaing balance with head  turned to the Lt. Good visual acuity with close range but significant balance challenge with head turns added. improved core control in seated core work.    PT Treatment/Interventions ADLs/Self Care Home Management;Cryotherapy;Aquatic Therapy;Moist Heat;Electrical Stimulation;Functional mobility training;Neuromuscular re-education;Balance training;Therapeutic exercise;Therapeutic activities;Patient/family education;Manual techniques;Passive range of motion;Dry needling;Taping    PT Next Visit Plan cont balance with core, add EO/EC and head turns    PT Home Exercise Plan [id number], pool exercises, sitting/standing posture with core vs lumbar    Consulted and Agree with Plan of Care Patient             Patient will benefit from skilled therapeutic intervention in order to improve the following deficits and impairments:  Decreased balance, Difficulty walking, Improper body mechanics, Postural dysfunction, Pain, Decreased strength, Decreased activity tolerance  Visit Diagnosis: Muscle weakness (generalized)  Repeated falls     Problem List Patient Active Problem List    Diagnosis Date Noted   After cataract of left eye not obscuring vision 07/26/2014   Glaucoma suspect, both eyes 08/02/2013   Conjunctivochalasis 04/07/2012   Dermatochalasis 04/07/2012   Dry eyes 04/07/2012   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   Meibomian gland dysfunction 04/07/2012   Myopia with astigmatism and presbyopia 04/07/2012   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 Antonieta Slaven PT, DPT 10/18/20 1:54 PM  [facility] [address] [place], [place], [address] Phone: [phone]   Fax:  [phone]  Name: Valoria Tamburri Taplin MRN: [id number] Date of Birth: 16-Mar-1946

## 2020-10-20 ENCOUNTER — Ambulatory Visit (HOSPITAL_BASED_OUTPATIENT_CLINIC_OR_DEPARTMENT_OTHER): Payer: Medicare Other | Admitting: Physical Therapy

## 2020-10-23 ENCOUNTER — Ambulatory Visit (HOSPITAL_BASED_OUTPATIENT_CLINIC_OR_DEPARTMENT_OTHER): Payer: Medicare Other | Admitting: Physical Therapy

## 2020-10-23 ENCOUNTER — Encounter (HOSPITAL_BASED_OUTPATIENT_CLINIC_OR_DEPARTMENT_OTHER): Payer: Self-pay | Admitting: Physical Therapy

## 2020-10-23 ENCOUNTER — Other Ambulatory Visit: Payer: Self-pay

## 2020-10-23 DIAGNOSIS — M6281 Muscle weakness (generalized): Secondary | ICD-10-CM

## 2020-10-23 DIAGNOSIS — R296 Repeated falls: Secondary | ICD-10-CM

## 2020-10-23 NOTE — Therapy (Signed)
[facility] [address] [place], [place], [address] Phone: [phone]   Fax:  [phone]  Physical Therapy Treatment  Patient Details  Name: Autumn Paul MRN: [id number] Date of Birth: Apr 21, 1945 Referring Provider (PT): Cleatis Polka., MD   Encounter Date: 10/23/2020   PT End of Session - 10/23/20 0843     Visit Number 17    Number of Visits 25    Date for PT Re-Evaluation 11/10/20    Authorization Type UHC MCR    PT Start Time 0820    PT Stop Time 0905    PT Time Calculation (min) 45 min    Equipment Utilized During Treatment Other (comment)    Activity Tolerance Patient tolerated treatment well    Behavior During Therapy WFL for tasks assessed/performed             Past Medical History:  Diagnosis Date   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   Subjective Assessment - 10/23/20 1212     Subjective "Doin well. Maybe a little back pain    Limitations Standing    Currently in Pain? No/denies                                       PT Education - 10/23/20 1035     Education Details safety in shower; using grab bar    Person(s) Educated Patient    Methods Explanation    Comprehension Verbalized understanding                 PT Long Term Goals - 09/28/20 0941       PT LONG TERM GOAL #1   Title 5TSTS to 12s    Baseline 13s    Status On-going      PT LONG TERM GOAL #2   Title BERG to improve by MDC    Baseline see flowsheet    Status Achieved      PT LONG TERM GOAL #3   Title bil hip abd to 5/5    Baseline see flowsheet    Status On-going      PT LONG TERM GOAL #4   Title  able to exercise without incr pain in feet    Status Achieved      PT LONG TERM GOAL #5   Title able to ambulate 45 min comfortably- progressing goal to 60 minutes today    Baseline has done 45 comfortably    Status On-going                   Plan - 10/23/20 1214     Clinical Impression Statement Balance challenges added with vision eliminated. Minor difficulty maintaining balance. Cueing for tight core improves balance.  Positional head changes with static standing working on improvment as noted at last visit. Decreased difficulty noted with static position.    Personal Factors and Comorbidities Fitness;Age;Comorbidity 1    Examination-Activity Limitations Squat;Bed Mobility;Stairs;Lift;Stand;Locomotion Level;Carry;Transfers;Sit    Examination-Participation Restrictions Shop;Meal Prep;Driving  Stability/Clinical Decision Making Stable/Uncomplicated    Clinical Decision Making Moderate    Rehab Potential Good    PT Frequency 2x / week    PT Duration 6 weeks    PT Treatment/Interventions ADLs/Self Care Home Management;Cryotherapy;Aquatic Therapy;Moist Heat;Electrical Stimulation;Functional mobility training;Neuromuscular re-education;Balance training;Therapeutic exercise;Therapeutic activities;Patient/family education;Manual techniques;Passive range of motion;Dry needling;Taping    PT Next Visit Plan Cont with head turns dynamic balance    PT Home Exercise Plan [id number], pool exercises, sitting/standing posture with core vs lumbar           AquaticREHABdocumentation: Water will allow for reduced gait deviation due to reduced joint loading through buoyancy to help patient improve posture without excess stress and pain.  and Water will allow for work on balance using up thrust to improve posture. The principles of viscosity will help slow movement allowing for better processing time during fall recovery practice.  Warm up Water walking forward, back and side stepping x 5 mins  with cueing for increased step length/pace and hand positioning for  increased resistance.  Seated Stretching gastroc, hamstring and adductors  Prone suspension LB ( erector spinae, and rotator) stretch with slight overpressure by therapist.  Standing Using ankle buoys hip add/abd 2 x15 Hip circles 2 x15 Kick board push downs 2 x10 push/pull rotation x 10 reps  Balance challenges: Noodle kick downs 3 x 10 reps decreasing ue support for balance challenging. Cueing for core activation. Advance to unsupported after multiple tries Static standing feet together vision eliminated. Manual perturbations Unilateral stance cueing for holding position for 10 seconds.  Patient will benefit from skilled therapeutic intervention in order to improve the following deficits and impairments:  Decreased balance, Difficulty walking, Improper body mechanics, Postural dysfunction, Pain, Decreased strength, Decreased activity tolerance  Visit Diagnosis: Muscle weakness (generalized)  Repeated falls     Problem List Patient Active Problem List   Diagnosis Date Noted   After cataract of left eye not obscuring vision 07/26/2014   Glaucoma suspect, both eyes 08/02/2013   Conjunctivochalasis 04/07/2012   Dermatochalasis 04/07/2012   Dry eyes 04/07/2012   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   Meibomian gland dysfunction 04/07/2012   Myopia with astigmatism and presbyopia 04/07/2012   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

## 2020-10-30 ENCOUNTER — Encounter (HOSPITAL_BASED_OUTPATIENT_CLINIC_OR_DEPARTMENT_OTHER): Payer: Self-pay | Admitting: Physical Therapy

## 2020-10-30 ENCOUNTER — Other Ambulatory Visit: Payer: Self-pay

## 2020-10-30 ENCOUNTER — Ambulatory Visit (HOSPITAL_BASED_OUTPATIENT_CLINIC_OR_DEPARTMENT_OTHER): Payer: Medicare Other | Admitting: Physical Therapy

## 2020-10-30 DIAGNOSIS — R296 Repeated falls: Secondary | ICD-10-CM

## 2020-10-30 DIAGNOSIS — M6281 Muscle weakness (generalized): Secondary | ICD-10-CM

## 2020-10-30 NOTE — Therapy (Signed)
[facility] [address] [place], [place], [address] Phone: [phone]   Fax:  [phone]  Physical Therapy Treatment  Patient Details  Name: Autumn Paul MRN: [id number] Date of Birth: Aug 16, 1945 Referring Provider (PT): Cleatis Polka., MD   Encounter Date: 10/30/2020   PT End of Session - 10/30/20 0830     Visit Number 18    Number of Visits 25    Date for PT Re-Evaluation 11/10/20    Authorization Type UHC MCR    PT Start Time 0815    PT Stop Time 0859    PT Time Calculation (min) 44 min    Equipment Utilized During Treatment Other (comment)    Activity Tolerance Patient tolerated treatment well    Behavior During Therapy WFL for tasks assessed/performed             Past Medical History:  Diagnosis Date   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   Subjective Assessment - 10/30/20 0825     Subjective "[place] with grandchildren last week without any problems or pain"    Currently in Pain? Yes    Pain Score 2     Pain Location Hip    Pain Orientation Right    Pain Descriptors / Indicators Aching    Pain Radiating Towards right buttock    Pain Onset More than a month ago    Pain Score 2    Pain Orientation Right    Pain Descriptors / Indicators Aching    Pain Type Chronic pain    Pain Onset More than a month ago    Pain Frequency Intermittent                                            PT Long Term Goals - 09/28/20 0941       PT LONG TERM GOAL #1   Title 5TSTS to 12s    Baseline 13s    Status On-going      PT LONG TERM GOAL #2   Title BERG to  improve by MDC    Baseline see flowsheet    Status Achieved      PT LONG TERM GOAL #3   Title bil hip abd to 5/5    Baseline see flowsheet    Status On-going      PT LONG TERM GOAL #4   Title able to exercise without incr pain in feet    Status Achieved      PT LONG TERM GOAL #5   Title able to ambulate 45 min comfortably- progressing goal to 60 minutes today    Baseline has done 45 comfortably    Status On-going                      AquaticREHABdocumentation: Water will allow for reduced gait deviation due to reduced joint loading through buoyancy to help patient improve posture without excess stress and  pain.  and Water will allow for work on balance using up thrust to improve posture. The principles of viscosity will help slow movement allowing for better processing time during fall recovery practice.   Warm up Water walking forward, back and side stepping x 5 mins with cueing for increased step length/pace and hand positioning for  increased resistance.   Seated Stretching gastroc, hamstring and adductors     Standing  Hip hinges 2 x 15 reps, while resting pt completes core rotation pushing kick board on top of water Kick board push downs 2 x15 SLS right than left 3 x tires each gaining balance x 20 seconds 1 trial left, 3 trials right push/pull rotation x 10 reps right then left. VC  for technique and speed   Balance challenges: Noodle kick downs 2 x 10 reps no UE support. Cueing for core activation. Advance to unsupported after multiple tries Static standing feet together vision eliminated. Manual perturbations 2 x 20 sec    Patient will benefit from skilled therapeutic intervention in order to improve the following deficits and impairments:  Decreased balance, Difficulty walking, Improper body mechanics, Postural dysfunction, Pain, Decreased strength, Decreased activity tolerance  Patient will benefit from skilled therapeutic intervention in order to improve  the following deficits and impairments:  Decreased balance, Difficulty walking, Improper body mechanics, Postural dysfunction, Pain, Decreased strength, Decreased activity tolerance  Visit Diagnosis: Muscle weakness (generalized)  Repeated falls     Problem List Patient Active Problem List   Diagnosis Date Noted   After cataract of left eye not obscuring vision 07/26/2014   Glaucoma suspect, both eyes 08/02/2013   Conjunctivochalasis 04/07/2012   Dermatochalasis 04/07/2012   Dry eyes 04/07/2012   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   Meibomian gland dysfunction 04/07/2012   Myopia with astigmatism and presbyopia 04/07/2012   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 Cross over stepping but maintaining balance (not straight path). Pt progressed with amb and standing tolerance walking 10,000 steps at zoo without lb, hip or foot pain.   Jeanmarie Hubert MPT 10/30/2020, 1:07 PM  [facility] [address] [place], [place], [address] Phone: [phone]   Fax:  [phone]  Name: Keni Wafer Brigante MRN: [id number] Date of Birth: [birth year]

## 2020-11-01 ENCOUNTER — Ambulatory Visit (HOSPITAL_BASED_OUTPATIENT_CLINIC_OR_DEPARTMENT_OTHER): Payer: Medicare Other | Admitting: Physical Therapy

## 2020-11-01 ENCOUNTER — Other Ambulatory Visit: Payer: Self-pay

## 2020-11-01 ENCOUNTER — Encounter (HOSPITAL_BASED_OUTPATIENT_CLINIC_OR_DEPARTMENT_OTHER): Payer: Self-pay | Admitting: Physical Therapy

## 2020-11-01 DIAGNOSIS — M6281 Muscle weakness (generalized): Secondary | ICD-10-CM

## 2020-11-01 DIAGNOSIS — R296 Repeated falls: Secondary | ICD-10-CM

## 2020-11-01 NOTE — Therapy (Signed)
[place], [place], [address] Phone: [phone]   Fax:  [phone]  Physical Therapy Treatment  Patient Details  Name: Autumn Paul MRN: [id number] Date of Birth: 04-12-1945 Referring Provider (PT): Ginger Organ., MD   Encounter Date: 11/01/2020   PT End of Session - 11/01/20 0847     Visit Number 19    Number of Visits 25    Date for PT Re-Evaluation 11/10/20    Authorization Type UHC MCR    Progress Note Due on Visit 29    PT Start Time 0845    PT Stop Time 0912    PT Time Calculation (min) 27 min    Activity Tolerance Patient tolerated treatment well    Behavior During Therapy WFL for tasks assessed/performed             Past Medical History:  Diagnosis Date   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   Subjective Assessment - 11/01/20 0852     Subjective No question that the pool is so helpful for me. I feel confident and stable. I am working in the gym on upper body and core. I was able to walk the [place] with the grand kids. Pain was pretty bad this AM  in lower back but it always gets better afte rI get up and move around.    How long can you walk comfortably? walked the [place]    Patient Stated Goals decrease pain- wakes at night. improve balance, build muscle, upper body in gym    Currently in Pain? Yes    Pain Score 6     Pain Location Back   pointing to bil SIJ   Pain Orientation Right;Left    Pain Descriptors / Indicators Aching    Pain Type Chronic pain    Pain Radiating Towards bil buttock    Aggravating Factors  AM pain    Pain Relieving Factors aquatic Rehab                 [facility] PT Assessment - 11/01/20 0001       Assessment   Medical Diagnosis OP & falls    Referring Provider (PT) Ginger Organ., MD      Strength   Overall Strength Comments bil gross strength 5/5      Special Tests   Other special tests 5TSTS 10s                                        PT Long Term Goals - 11/01/20 0900       PT LONG TERM GOAL #1   Title 5TSTS to 12s    Baseline 10s    Status Achieved      PT LONG TERM GOAL #2   Title BERG to improve by MDC    Baseline see flowsheet    Status Achieved      PT LONG TERM GOAL #3   Title bil hip abd  to 5/5    Status Achieved      PT LONG TERM GOAL #4   Title able to exercise without incr pain in feet    Status Achieved      PT LONG TERM GOAL #5   Title able to ambulate 45 min comfortably- progressing goal to 60 minutes today    Status Achieved                   Plan - 11/01/20 0917     Clinical Impression Statement Reviewed objective measures and pt has met all of her goals at this time. Would like to use the last 3 visits in her POC to finalized HEP for pool as she feels this has given her the largest benefit. She would like to cont aquatic rehab but does not have immediate access to a pool- in order to avoid gap in exercise, pt will consider her options of community resources for a pool to join in order to continue with her aquatic exercises.    PT Treatment/Interventions ADLs/Self Care Home Management;Cryotherapy;Aquatic Therapy;Moist Heat;Electrical Stimulation;Functional mobility training;Neuromuscular re-education;Balance training;Therapeutic exercise;Therapeutic activities;Patient/family education;Manual techniques;Passive range of motion;Dry needling;Taping    PT Next Visit Plan finalize HEP in water- balance, strength, endurance    PT Home Exercise Plan [id number], pool exercises, sitting/standing posture with core vs lumbar    Consulted and Agree with Plan of Care Patient              Patient will benefit from skilled therapeutic intervention in order to improve the following deficits and impairments:  Decreased balance, Difficulty walking, Improper body mechanics, Postural dysfunction, Pain, Decreased strength, Decreased activity tolerance  Visit Diagnosis: Muscle weakness (generalized)  Repeated falls     Problem List Patient Active Problem List   Diagnosis Date Noted   After cataract of left eye not obscuring vision 07/26/2014   Glaucoma suspect, both eyes 08/02/2013   Conjunctivochalasis 04/07/2012   Dermatochalasis 04/07/2012   Dry eyes 04/07/2012   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   Meibomian gland dysfunction 04/07/2012   Myopia with astigmatism and presbyopia 04/07/2012   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 Hightower PT, DPT 11/01/20 9:21 AM  [facility] [place], [place], [address] Phone: [phone]   Fax:  [phone]  Name: Autumn Paul MRN: [id number] Date of Birth: 16-Aug-1945

## 2020-11-06 ENCOUNTER — Other Ambulatory Visit: Payer: Self-pay

## 2020-11-06 ENCOUNTER — Ambulatory Visit (HOSPITAL_BASED_OUTPATIENT_CLINIC_OR_DEPARTMENT_OTHER): Payer: Medicare Other | Admitting: Physical Therapy

## 2020-11-06 ENCOUNTER — Encounter (HOSPITAL_BASED_OUTPATIENT_CLINIC_OR_DEPARTMENT_OTHER): Payer: Self-pay | Admitting: Physical Therapy

## 2020-11-06 DIAGNOSIS — R296 Repeated falls: Secondary | ICD-10-CM

## 2020-11-06 DIAGNOSIS — M6281 Muscle weakness (generalized): Secondary | ICD-10-CM

## 2020-11-06 NOTE — Therapy (Signed)
[facility] [address] [place], [place], [address] Phone: [phone]   Fax:  [phone]  Physical Therapy Treatment  Patient Details  Name: Mahasin Riviere Krus MRN: [id number] Date of Birth: March 08, 1946 Referring Provider (PT): Cleatis Polka., MD   Encounter Date: 11/06/2020   PT End of Session - 11/06/20 0848     Visit Number 20    Number of Visits 25    Date for PT Re-Evaluation 11/10/20    Authorization Type UHC MCR    Progress Note Due on Visit 29    PT Start Time 0845    PT Stop Time 0927    PT Time Calculation (min) 42 min    Activity Tolerance Patient tolerated treatment well    Behavior During Therapy [facility] for tasks assessed/performed             Past Medical History:  Diagnosis Date   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   Subjective Assessment - 11/06/20 0847     Subjective Feeling pretty good today.    Currently in Pain? No/denies                      AquaticREHABdocumentation: Water will allow for reduced gait deviation due to reduced joint loading through buoyancy to help patient improve posture without excess stress and pain.  and Hydrostatic pressure also supports joints by unweighting joint load by at least 50 % in 3-4 feet depth water. 80% in chest to neck deep water. Fwd & side high stepping Hip  hinge with fwd reach using water bar- prog to single leg hinge Backward lunge- breaking up steps for form Standing hip circles with water bar Standing hip flex/ext with dumbbells Tandem with head turns- no arms in water Overhead reaches with NBOS & alt fwd step Stretch: piriformis pull off of  handrails, gastroc on step, HS float with noodle, hip flexor with noodle                       PT Long Term Goals - 11/01/20 0900       PT LONG TERM GOAL #1   Title 5TSTS to 12s    Baseline 10s    Status Achieved      PT LONG TERM GOAL #2   Title BERG to improve by MDC    Baseline see flowsheet    Status Achieved      PT LONG TERM GOAL #3   Title bil hip abd to 5/5    Status Achieved      PT LONG TERM GOAL #4   Title able to exercise without incr pain in feet    Status Achieved      PT LONG TERM GOAL #5   Title able to ambulate 45 min comfortably- progressing goal to 60 minutes today    Status Achieved                   Plan - 11/06/20 2016     Clinical Impression Statement focused on balance in session today- encouraged  use of stepping to correct balance rather than UEs. Notable difficulty with balance maintenance when adding head turns. began with stretching regimen near the end of the session and will continue with this next time.    PT Treatment/Interventions ADLs/Self Care Home Management;Cryotherapy;Aquatic Therapy;Moist Heat;Electrical Stimulation;Functional mobility training;Neuromuscular re-education;Balance training;Therapeutic exercise;Therapeutic activities;Patient/family education;Manual techniques;Passive range of motion;Dry needling;Taping    PT Next Visit Plan finalize HEP in water- balance, strength, endurance    PT Home Exercise Plan [id number], pool exercises, sitting/standing posture with core vs lumbar    Consulted and Agree with Plan of Care Patient             Patient will benefit from skilled therapeutic intervention in order to improve the following deficits and impairments:  Decreased balance, Difficulty walking, Improper body mechanics, Postural dysfunction, Pain, Decreased strength, Decreased activity tolerance  Visit Diagnosis: Muscle weakness (generalized)  Repeated falls     Problem List Patient Active  Problem List   Diagnosis Date Noted   After cataract of left eye not obscuring vision 07/26/2014   Glaucoma suspect, both eyes 08/02/2013   Conjunctivochalasis 04/07/2012   Dermatochalasis 04/07/2012   Dry eyes 04/07/2012   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   Meibomian gland dysfunction 04/07/2012   Myopia with astigmatism and presbyopia 04/07/2012   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 Shakiara Lukic PT, DPT 11/06/20 8:21 PM   [facility] [address] [place], [place], [address] Phone: [phone]   Fax:  [phone]  Name: Dossie Ocanas Trotta MRN: [id number] Date of Birth: 1945-10-02

## 2020-11-09 ENCOUNTER — Other Ambulatory Visit: Payer: Self-pay

## 2020-11-09 ENCOUNTER — Ambulatory Visit (HOSPITAL_BASED_OUTPATIENT_CLINIC_OR_DEPARTMENT_OTHER): Payer: Medicare Other | Admitting: Physical Therapy

## 2020-11-09 ENCOUNTER — Encounter (HOSPITAL_BASED_OUTPATIENT_CLINIC_OR_DEPARTMENT_OTHER): Payer: Self-pay | Admitting: Physical Therapy

## 2020-11-09 DIAGNOSIS — R296 Repeated falls: Secondary | ICD-10-CM

## 2020-11-09 DIAGNOSIS — M6281 Muscle weakness (generalized): Secondary | ICD-10-CM

## 2020-11-09 NOTE — Therapy (Signed)
[place], [place], [address] Phone: [phone]   Fax:  [phone]  Physical Therapy Treatment/Discharge  Patient Details  Name: Autumn Paul MRN: [id number] Date of Birth: 1945-04-22 Referring Provider (PT): Ginger Organ., MD   Encounter Date: 11/09/2020   PT End of Session - 11/09/20 0852     Visit Number 21    Number of Visits 25    Date for PT Re-Evaluation 11/10/20    Authorization Type UHC MCR    PT Start Time 0846    PT Stop Time 0925    PT Time Calculation (min) 39 min    Activity Tolerance Patient tolerated treatment well    Behavior During Therapy 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   Subjective Assessment - 11/09/20 1719     Subjective I walked about 2 miles and then did the machines at the gym. I am sore bil buttocks and hamstrings.    Patient Stated Goals decrease pain- wakes at night. improve balance, build muscle, upper body in gym                   AquaticREHABdocumentation: Pt requires the buoyancy of water for active assisted exercises with buoyancy supported for strengthening & ROM exercises, Hydrostatic pressure also supports joints by unweighting joint load by at least 50 % in 3-4 feet depth water. 80% in chest to neck deep water., and Water current provides perturbations which challenge standing balance unsupported Seated HSS Seated piriformis stretch Standing float quad & hi flexor stretch Adductor lunge stretch Tandem stance with options Squats & option for single leg Lunges+alt  UE                    PT Education - 11/09/20 1720     Education Details importance of continued education    Person(s) Educated Patient    Methods Explanation    Comprehension Verbalized understanding                 PT Long Term Goals - 11/01/20 0900       PT LONG TERM GOAL #1   Title 5TSTS to 12s    Baseline 10s    Status Achieved      PT LONG TERM GOAL #2   Title BERG to improve by [place]    Baseline see flowsheet    Status Achieved      PT LONG TERM GOAL #3   Title bil hip abd to 5/5    Status Achieved      PT LONG TERM GOAL #4   Title able to exercise without incr pain in feet    Status Achieved      PT LONG TERM GOAL #5   Title able to ambulate 45 min comfortably- progressing goal to 60 minutes today    Status Achieved  Plan - 11/09/20 1720     Clinical Impression Statement HEP was finalized today and pt reports she will be signing up for 2 week trial at [facility] to see if the program works for her to continue with.Discussed that OP will continue to be present but that exercises will be helpful in maintaing bone mass and function. Encouraged her to contact me with any further questions.    PT Treatment/Interventions ADLs/Self Care Home Management;Cryotherapy;Aquatic Therapy;Moist Heat;Electrical Stimulation;Functional mobility training;Neuromuscular re-education;Balance training;Therapeutic exercise;Therapeutic activities;Patient/family education;Manual techniques;Passive range of motion;Dry needling;Taping    PT Home Exercise Plan [id number], pool exercises, sitting/standing posture with core vs lumbar    Consulted and Agree with Plan of Care Patient             Patient will benefit from skilled therapeutic intervention in order to improve the following deficits and impairments:  Decreased balance, Difficulty walking, Improper body mechanics, Postural dysfunction, Pain, Decreased strength, Decreased activity  tolerance  Visit Diagnosis: Muscle weakness (generalized)  Repeated falls     Problem List Patient Active Problem List   Diagnosis Date Noted   After cataract of left eye not obscuring vision 07/26/2014   Glaucoma suspect, both eyes 08/02/2013   Conjunctivochalasis 04/07/2012   Dermatochalasis 04/07/2012   Dry eyes 04/07/2012   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   Meibomian gland dysfunction 04/07/2012   Myopia with astigmatism and presbyopia 04/07/2012   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 Patient goals were met. Patient is being discharged due to meeting the stated rehab goals. Brihana Quickel C. Lauraann Missey PT, DPT 11/09/20 5:31 PM   [facility] [address] [place], [place], [address] Phone: [phone]   Fax:  [phone]  Name: Autumn Paul MRN: [id number] Date of Birth: 06/05/1945

## 2020-11-13 ENCOUNTER — Ambulatory Visit (HOSPITAL_BASED_OUTPATIENT_CLINIC_OR_DEPARTMENT_OTHER): Payer: Medicare Other | Admitting: Physical Therapy

## 2020-11-15 ENCOUNTER — Encounter (HOSPITAL_BASED_OUTPATIENT_CLINIC_OR_DEPARTMENT_OTHER): Payer: Medicare Other | Admitting: Physical Therapy

## 2020-11-21 ENCOUNTER — Encounter (HOSPITAL_BASED_OUTPATIENT_CLINIC_OR_DEPARTMENT_OTHER): Payer: Medicare Other | Admitting: Physical Therapy

## 2020-11-23 ENCOUNTER — Encounter (HOSPITAL_BASED_OUTPATIENT_CLINIC_OR_DEPARTMENT_OTHER): Payer: Medicare Other | Admitting: Physical Therapy

## 2020-12-20 ENCOUNTER — Ambulatory Visit (INDEPENDENT_AMBULATORY_CARE_PROVIDER_SITE_OTHER): Payer: Medicare Other | Admitting: Podiatry

## 2020-12-20 ENCOUNTER — Encounter: Payer: Self-pay | Admitting: Podiatry

## 2020-12-20 ENCOUNTER — Ambulatory Visit (INDEPENDENT_AMBULATORY_CARE_PROVIDER_SITE_OTHER): Payer: Medicare Other

## 2020-12-20 ENCOUNTER — Other Ambulatory Visit: Payer: Self-pay

## 2020-12-20 DIAGNOSIS — M722 Plantar fascial fibromatosis: Secondary | ICD-10-CM

## 2020-12-20 MED ORDER — DEXAMETHASONE SODIUM PHOSPHATE 120 MG/30ML IJ SOLN
4.0000 mg | Freq: Once | INTRAMUSCULAR | Status: AC
  Administered 2020-12-20: 4 mg via INTRA_ARTICULAR

## 2020-12-20 NOTE — Progress Notes (Signed)
  Subjective:  Patient ID: Autumn Paul, female    DOB: 05/21/1945,   MRN: [id number]  Chief Complaint  Patient presents with   Plantar Fasciitis    Bilateral PF Left over right. Pt state pain has increased since wearing water shoe as everyday shoes.     75 y.o. female presents for bilateral plantar fasciitis left worse than right. States she has been having increased pain in the left and has not been wearing a brace. Relates she does have spinal stenosis and relates burning pain from her toes and up her leg . Denies any other pedal complaints. Denies n/v/f/c.   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 CFT <3 seconds. Normal hair growth on digits. No edema.  Skin. No lacerations or abrasions bilateral feet.  Musculoskeletal: MMT 5/5 bilateral lower extremities in DF, PF, Inversion and Eversion. Deceased ROM in DF of ankle joint. Tender to lateral band of left plantar fascia. Tender to medial band of plantar fascia on right.  Neurological: Sensation intact to light touch.   Assessment:   1. Plantar fasciitis of left foot   2. Plantar fasciitis of right foot      Plan:  Patient was evaluated and treated and all questions answered. Discussed plantar fasciitis with patient.  Discussed treatment options including, ice, NSAIDS, supportive shoes, bracing, and stretching. Stretching exercises provided to be done on a daily basis.   Continue with celebrex.  Plantar fascial brace dispensed.  Patient requesting injection today. Procedure note below.   Follow-up 6 weeks or sooner if any problems arise. In the meantime, encouraged to call the office with any questions, concerns, change in symptoms.   Procedure:  Discussed etiology, pathology, conservative vs. surgical therapies. At this time a plantar fascial injection was  recommended.  The patient agreed and a sterile skin prep was applied.  An injection consisting of kenalog dexamethasone and marcaine mixture was infiltrated at the point of maximal tenderness on the left Heel.  Bandaid applied. The patient tolerated this well and was given instructions for aftercare.    Louann Sjogren, DPM

## 2020-12-20 NOTE — Patient Instructions (Signed)
Plantar Fasciitis (Heel Spur Syndrome) with Rehab The plantar fascia is a fibrous, ligament-like, soft-tissue structure that spans the bottom of the foot. Plantar fasciitis is a condition that causes pain in the foot due to inflammation of the tissue. SYMPTOMS   Pain and tenderness on the underneath side of the foot.  Pain that worsens with standing or walking. CAUSES  Plantar fasciitis is caused by irritation and injury to the plantar fascia on the underneath side of the foot. Common mechanisms of injury include:  Direct trauma to bottom of the foot.  Damage to a small nerve that runs under the foot where the main fascia attaches to the heel bone.  Stress placed on the plantar fascia due to bone spurs. RISK INCREASES WITH:   Activities that place stress on the plantar fascia (running, jumping, pivoting, or cutting).  Poor strength and flexibility.  Improperly fitted shoes.  Tight calf muscles.  Flat feet.  Failure to warm-up properly before activity.  Obesity. PREVENTION  Warm up and stretch properly before activity.  Allow for adequate recovery between workouts.  Maintain physical fitness:  Strength, flexibility, and endurance.  Cardiovascular fitness.  Maintain a health body weight.  Avoid stress on the plantar fascia.  Wear properly fitted shoes, including arch supports for individuals who have flat feet.  PROGNOSIS  If treated properly, then the symptoms of plantar fasciitis usually resolve without surgery. However, occasionally surgery is necessary.  RELATED COMPLICATIONS   Recurrent symptoms that Heying result in a chronic condition.  Problems of the lower back that are caused by compensating for the injury, such as limping.  Pain or weakness of the foot during push-off following surgery.  Chronic inflammation, scarring, and partial or complete fascia tear, occurring more often from repeated injections.  TREATMENT  Treatment initially involves the  use of ice and medication to help reduce pain and inflammation. The use of strengthening and stretching exercises Ottaviano help reduce pain with activity, especially stretches of the Achilles tendon. These exercises Haffey be performed at home or with a therapist. Your caregiver Azucena recommend that you use heel cups of arch supports to help reduce stress on the plantar fascia. Occasionally, corticosteroid injections are given to reduce inflammation. If symptoms persist for greater than 6 months despite non-surgical (conservative), then surgery Scharfenberg be recommended.   MEDICATION   If pain medication is necessary, then nonsteroidal anti-inflammatory medications, such as aspirin and ibuprofen, or other minor pain relievers, such as acetaminophen, are often recommended.  Do not take pain medication within 7 days before surgery.  Prescription pain relievers Barradas be given if deemed necessary by your caregiver. Use only as directed and only as much as you need.  Corticosteroid injections Latour be given by your caregiver. These injections should be reserved for the most serious cases, because they Beaubrun only be given a certain number of times.  HEAT AND COLD  Cold treatment (icing) relieves pain and reduces inflammation. Cold treatment should be applied for 10 to 15 minutes every 2 to 3 hours for inflammation and pain and immediately after any activity that aggravates your symptoms. Use ice packs or massage the area with a piece of ice (ice massage).  Heat treatment Amero be used prior to performing the stretching and strengthening activities prescribed by your caregiver, physical therapist, or athletic trainer. Use a heat pack or soak the injury in warm water.  SEEK IMMEDIATE MEDICAL CARE IF:  Treatment seems to offer no benefit, or the condition worsens.  Any medications   produce adverse side effects.  EXERCISES- RANGE OF MOTION (ROM) AND STRETCHING EXERCISES - Plantar Fasciitis (Heel Spur Syndrome) These exercises  Brasington help you when beginning to rehabilitate your injury. Your symptoms Rettke resolve with or without further involvement from your physician, physical therapist or athletic trainer. While completing these exercises, remember:   Restoring tissue flexibility helps normal motion to return to the joints. This allows healthier, less painful movement and activity.  An effective stretch should be held for at least 30 seconds.  A stretch should never be painful. You should only feel a gentle lengthening or release in the stretched tissue.  RANGE OF MOTION - Toe Extension, Flexion  Sit with your right / left leg crossed over your opposite knee.  Grasp your toes and gently pull them back toward the top of your foot. You should feel a stretch on the bottom of your toes and/or foot.  Hold this stretch for 10 seconds.  Now, gently pull your toes toward the bottom of your foot. You should feel a stretch on the top of your toes and or foot.  Hold this stretch for 10 seconds. Repeat  times. Complete this stretch 3 times per day.   RANGE OF MOTION - Ankle Dorsiflexion, Active Assisted  Remove shoes and sit on a chair that is preferably not on a carpeted surface.  Place right / left foot under knee. Extend your opposite leg for support.  Keeping your heel down, slide your right / left foot back toward the chair until you feel a stretch at your ankle or calf. If you do not feel a stretch, slide your bottom forward to the edge of the chair, while still keeping your heel down.  Hold this stretch for 10 seconds. Repeat 3 times. Complete this stretch 2 times per day.   STRETCH  Gastroc, Standing  Place hands on wall.  Extend right / left leg, keeping the front knee somewhat bent.  Slightly point your toes inward on your back foot.  Keeping your right / left heel on the floor and your knee straight, shift your weight toward the wall, not allowing your back to arch.  You should feel a gentle stretch  in the right / left calf. Hold this position for 10 seconds. Repeat 3 times. Complete this stretch 2 times per day.  STRETCH  Soleus, Standing  Place hands on wall.  Extend right / left leg, keeping the other knee somewhat bent.  Slightly point your toes inward on your back foot.  Keep your right / left heel on the floor, bend your back knee, and slightly shift your weight over the back leg so that you feel a gentle stretch deep in your back calf.  Hold this position for 10 seconds. Repeat 3 times. Complete this stretch 2 times per day.  STRETCH  Gastrocsoleus, Standing  Note: This exercise can place a lot of stress on your foot and ankle. Please complete this exercise only if specifically instructed by your caregiver.   Place the ball of your right / left foot on a step, keeping your other foot firmly on the same step.  Hold on to the wall or a rail for balance.  Slowly lift your other foot, allowing your body weight to press your heel down over the edge of the step.  You should feel a stretch in your right / left calf.  Hold this position for 10 seconds.  Repeat this exercise with a slight bend in your right /   left knee. Repeat 3 times. Complete this stretch 2 times per day.   STRENGTHENING EXERCISES - Plantar Fasciitis (Heel Spur Syndrome)  These exercises Thresher help you when beginning to rehabilitate your injury. They Benbrook resolve your symptoms with or without further involvement from your physician, physical therapist or athletic trainer. While completing these exercises, remember:   Muscles can gain both the endurance and the strength needed for everyday activities through controlled exercises.  Complete these exercises as instructed by your physician, physical therapist or athletic trainer. Progress the resistance and repetitions only as guided.  STRENGTH - Towel Curls  Sit in a chair positioned on a non-carpeted surface.  Place your foot on a towel, keeping your heel  on the floor.  Pull the towel toward your heel by only curling your toes. Keep your heel on the floor. Repeat 3 times. Complete this exercise 2 times per day.  STRENGTH - Ankle Inversion  Secure one end of a rubber exercise band/tubing to a fixed object (table, pole). Loop the other end around your foot just before your toes.  Place your fists between your knees. This will focus your strengthening at your ankle.  Slowly, pull your big toe up and in, making sure the band/tubing is positioned to resist the entire motion.  Hold this position for 10 seconds.  Have your muscles resist the band/tubing as it slowly pulls your foot back to the starting position. Repeat 3 times. Complete this exercises 2 times per day.  Document Released: 03/04/2005 Document Revised: 05/27/2011 Document Reviewed: 06/16/2008 [employer] Patient Information 2014 [employer]. For instructions on how to put on your Plantar Fascial Brace, please visit [email or web address] 

## 2021-01-11 ENCOUNTER — Encounter (HOSPITAL_COMMUNITY): Payer: Medicare Other

## 2021-01-31 ENCOUNTER — Ambulatory Visit: Payer: Medicare Other | Admitting: Podiatry

## 2021-04-16 ENCOUNTER — Other Ambulatory Visit (HOSPITAL_COMMUNITY): Payer: Self-pay

## 2021-04-17 ENCOUNTER — Encounter (HOSPITAL_COMMUNITY): Payer: Self-pay

## 2021-04-17 ENCOUNTER — Inpatient Hospital Stay (HOSPITAL_COMMUNITY): Admission: RE | Admit: 2021-04-17 | Payer: Medicare Other | Source: Ambulatory Visit

## 2021-05-01 ENCOUNTER — Ambulatory Visit (INDEPENDENT_AMBULATORY_CARE_PROVIDER_SITE_OTHER): Payer: Medicare Other | Admitting: Podiatry

## 2021-05-01 ENCOUNTER — Other Ambulatory Visit: Payer: Self-pay

## 2021-05-01 DIAGNOSIS — M25371 Other instability, right ankle: Secondary | ICD-10-CM

## 2021-05-01 NOTE — Progress Notes (Signed)
°  Subjective:  Patient ID: Autumn Paul, female    DOB: 30-Oct-1945,  MRN: [id number]  Chief Complaint  Patient presents with   Plantar Fasciitis    history of PF not improving/ right foot pain in arch area    76 y.o. female presents with the above complaint. History confirmed with patient.  Arch pain is actually feeling better most the pain is now on the lateral ankle for her.  She notes no recent injury but notes that when she walks it causes pain and pressure on the outside of the ankle towards the fifth metatarsal base.  Feels like the ankle gives out often when she walks.  She also wonders if the brace that she has been wearing has been causing varicose veins to rupture or become more prominent  Objective:  Physical Exam: warm, good capillary refill, no trophic changes or ulcerative lesions, normal DP and PT pulses, normal sensory exam, and she has small varicosities no bleeding or ecchymotic areas no hemosiderosis, pain on the ATFL CFL and peroneal tendons laterally..  Assessment:   1. Ankle instability, right      Plan:  Patient was evaluated and treated and all questions answered.  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  I recommended immobilization in an ankle brace which was dispensed today.  She can can discontinue the plantar fascia braces.  I do not think the plantar fascia braces are causing the varicose veins but Stroebel be causing pressure on them she likely has chronic venous insufficiency that is developing with age-related changes.  I sent a referral to [facility] physical therapy for her to work on stabilization and balance training and gait training for her ankle.  I will see her back in 2 months for reevaluation. Return in about 2 months (around 06/29/2021) for recheck ankle instability.

## 2021-07-09 ENCOUNTER — Ambulatory Visit: Payer: Medicare Other | Admitting: Podiatry

## 2021-07-23 ENCOUNTER — Ambulatory Visit: Payer: Medicare Other | Admitting: Podiatry

## 2021-12-10 ENCOUNTER — Other Ambulatory Visit: Payer: Self-pay | Admitting: Internal Medicine

## 2021-12-10 DIAGNOSIS — R10816 Epigastric abdominal tenderness: Secondary | ICD-10-CM

## 2021-12-13 ENCOUNTER — Ambulatory Visit
Admission: RE | Admit: 2021-12-13 | Discharge: 2021-12-13 | Disposition: A | Discharge status: Home / Self Care | Payer: Medicare Other | Source: Ambulatory Visit | Attending: Internal Medicine | Admitting: Internal Medicine

## 2021-12-13 DIAGNOSIS — R10816 Epigastric abdominal tenderness: Secondary | ICD-10-CM

## 2021-12-17 ENCOUNTER — Other Ambulatory Visit: Payer: Self-pay | Admitting: Internal Medicine

## 2021-12-17 DIAGNOSIS — R160 Hepatomegaly, not elsewhere classified: Secondary | ICD-10-CM

## 2022-01-01 ENCOUNTER — Ambulatory Visit
Admission: RE | Admit: 2022-01-01 | Discharge: 2022-01-01 | Disposition: A | Discharge status: Home / Self Care | Payer: Medicare Other | Source: Ambulatory Visit | Attending: Internal Medicine | Admitting: Internal Medicine

## 2022-01-01 DIAGNOSIS — R160 Hepatomegaly, not elsewhere classified: Secondary | ICD-10-CM

## 2022-01-01 MED ORDER — GADOPICLENOL 0.5 MMOL/ML IV SOLN
7.5000 mL | Freq: Once | INTRAVENOUS | Status: AC | PRN
  Administered 2022-01-01: 7.5 mL via INTRAVENOUS

## 2022-03-04 ENCOUNTER — Other Ambulatory Visit: Payer: Self-pay | Admitting: Obstetrics and Gynecology

## 2022-03-04 DIAGNOSIS — Z8249 Family history of ischemic heart disease and other diseases of the circulatory system: Secondary | ICD-10-CM

## 2022-04-15 ENCOUNTER — Ambulatory Visit
Admission: RE | Admit: 2022-04-15 | Discharge: 2022-04-15 | Disposition: A | Discharge status: Home / Self Care | Payer: Medicare Other | Source: Ambulatory Visit | Attending: Obstetrics and Gynecology | Admitting: Obstetrics and Gynecology

## 2022-04-15 DIAGNOSIS — Z8249 Family history of ischemic heart disease and other diseases of the circulatory system: Secondary | ICD-10-CM

## 2022-05-13 ENCOUNTER — Encounter: Payer: Self-pay | Admitting: Internal Medicine

## 2022-07-02 ENCOUNTER — Ambulatory Visit: Payer: Medicare Other | Admitting: Internal Medicine

## 2023-02-24 ENCOUNTER — Other Ambulatory Visit: Payer: Self-pay | Admitting: Obstetrics and Gynecology

## 2023-02-24 DIAGNOSIS — Z87891 Personal history of nicotine dependence: Secondary | ICD-10-CM

## 2023-07-20 ENCOUNTER — Emergency Department (HOSPITAL_COMMUNITY)

## 2023-07-20 ENCOUNTER — Emergency Department (HOSPITAL_COMMUNITY)
Admission: EM | Admit: 2023-07-20 | Discharge: 2023-07-20 | Disposition: A | Discharge status: Home / Self Care | Attending: Emergency Medicine | Admitting: Emergency Medicine

## 2023-07-20 ENCOUNTER — Encounter (HOSPITAL_COMMUNITY): Payer: Self-pay | Admitting: Emergency Medicine

## 2023-07-20 ENCOUNTER — Other Ambulatory Visit: Payer: Self-pay

## 2023-07-20 DIAGNOSIS — M5442 Lumbago with sciatica, left side: Secondary | ICD-10-CM | POA: Diagnosis not present

## 2023-07-20 DIAGNOSIS — M545 Low back pain, unspecified: Secondary | ICD-10-CM | POA: Diagnosis present

## 2023-07-20 DIAGNOSIS — M5432 Sciatica, left side: Secondary | ICD-10-CM

## 2023-07-20 HISTORY — DX: Spinal stenosis, site unspecified: M48.00

## 2023-07-20 MED ORDER — PREDNISONE 10 MG PO TABS: ORAL_TABLET | ORAL | 0 refills | Status: AC

## 2023-07-20 MED ORDER — OXYCODONE-ACETAMINOPHEN 5-325 MG PO TABS: 1.0000 | ORAL_TABLET | Freq: Four times a day (QID) | ORAL | 0 refills | Status: AC | PRN

## 2023-07-20 MED ORDER — MORPHINE SULFATE (PF) 4 MG/ML IV SOLN
4.0000 mg | Freq: Once | INTRAVENOUS | Status: AC
  Administered 2023-07-20: 4 mg via INTRAVENOUS
  Filled 2023-07-20: qty 1

## 2023-07-20 MED ORDER — ONDANSETRON HCL 4 MG/2ML IJ SOLN: 4.0000 mg | Freq: Once | INTRAMUSCULAR | Status: DC | PRN

## 2023-07-20 MED ORDER — DOCUSATE SODIUM 100 MG PO CAPS: 100.0000 mg | ORAL_CAPSULE | Freq: Two times a day (BID) | ORAL | 0 refills | Status: AC

## 2023-07-20 NOTE — ED Triage Notes (Signed)
 Pain in mid back, radiating down to hip and down leg- had a cortisone shot in back 2 weeks ago. Pain is worse with laying and getting up from laying down.  No incontinence.  Right knee pain also

## 2023-07-20 NOTE — ED Provider Notes (Signed)
 [place] EMERGENCY DEPARTMENT AT [facility] Provider Note   CSN: [id number] Arrival date & time: 07/20/23  1014     History  Chief Complaint  Patient presents with   Back Pain   Sciatica    Autumn Paul is a 78 y.o. female.   Back Pain    Patient has a history of hyperlipidemia glaucoma, fibromyalgia, spinal stenosis.  Patient states she is also been having some trouble with pain in her left knee.  She has seen both neurosurgical doctors as well as orthopedic doctors.  Patient states she recently had a spinal injection.  Patient is not feeling much better after that.  The spinal injection was 2 weeks ago.  Patient states on Friday she started having increasing pain that feels different than her typical pain.  It is in her left lower back area going towards her left hip.  It does increase with movements and positions.  It is worse when she is lying flat.  She feels better sitting up.  She denies any acute weakness.  No trouble with abdominal pain.  No incontinence.  Home Medications Prior to Admission medications   Medication Sig Start Date End Date Taking? Authorizing Provider  docusate sodium (COLACE) 100 MG capsule Take 1 capsule (100 mg total) by mouth every 12 (twelve) hours. 07/20/23  Yes Trish Furl, MD  oxyCODONE-acetaminophen (PERCOCET/ROXICET) 5-325 MG tablet Take 1 tablet by mouth every 6 (six) hours as needed for severe pain (pain score 7-10). 07/20/23  Yes Trish Furl, MD  predniSONE (DELTASONE) 10 MG tablet Take 4 tablets (40 mg total) by mouth daily with breakfast for 2 days, THEN 3 tablets (30 mg total) daily with breakfast for 2 days, THEN 2 tablets (20 mg total) daily with breakfast for 2 days, THEN 1 tablet (10 mg total) daily with breakfast for 2 days. 07/20/23 07/28/23 Yes Trish Furl, MD  alendronate (FOSAMAX) 70 MG tablet Take 70 mg by mouth once a week. Take with a full glass of water on an empty stomach.    [provider]  Ascorbic Acid (VITAMIN  C PO) Take by mouth.    [provider]  celecoxib (CELEBREX) 200 MG capsule Take 200 mg by mouth 2 (two) times daily.    [provider]  diclofenac Sodium (VOLTAREN) 1 % GEL Apply topically. 07/29/19   [provider]  dorzolamide-timolol (COSOPT) 22.3-6.8 MG/ML ophthalmic solution 1 drop 2 (two) times daily. 09/12/19   [provider]  Ergocalciferol (VITAMIN D2 PO) Take 1.25 mg by mouth once a week.    [provider]  esomeprazole (NEXIUM) 20 MG capsule Take 20 mg by mouth daily at 12 noon.    [provider]  ezetimibe (ZETIA) 10 MG tablet Take 10 mg by mouth daily. 10/01/19   [provider]  FLUTICASONE PROPIONATE, NASAL, NA Place into the nose at bedtime.    [provider]  latanoprost (XALATAN) 0.005 % ophthalmic solution 1 drop at bedtime. 05/18/19   [provider]  nitrofurantoin, macrocrystal-monohydrate, (MACROBID) 100 MG capsule Take 100 mg by mouth every 12 (twelve) hours. Patient not taking: Reported on 07/17/2020 10/26/19   [provider]  ramelteon (ROZEREM) 8 MG tablet Take 8 mg by mouth at bedtime.    [provider]  vitamin B-12 (CYANOCOBALAMIN) 500 MCG tablet Take 500 mcg by mouth daily.    [provider]      Allergies    Aleve [naproxen sodium] and Sulfa antibiotics  Review of Systems   Review of Systems  Musculoskeletal:  Positive for back pain.    Physical Exam Updated Vital Signs BP (!) 165/108 (BP Location: Right Arm)   Pulse 80   Temp 98.4 F (36.9 C)   Resp 16   Ht 1.676 m (5\' 6" )   Wt 73 kg   SpO2 97%   BMI 25.99 kg/m  Physical Exam Vitals and nursing note reviewed.  Constitutional:      General: She is not in acute distress.    Appearance: She is well-developed.  HENT:     Head: Normocephalic and atraumatic.     Right Ear: External ear normal.     Left Ear: External ear normal.  Eyes:     General: No scleral icterus.       Right eye:  No discharge.        Left eye: No discharge.     Conjunctiva/sclera: Conjunctivae normal.  Neck:     Trachea: No tracheal deviation.  Cardiovascular:     Rate and Rhythm: Normal rate.  Pulmonary:     Effort: Pulmonary effort is normal. No respiratory distress.     Breath sounds: No stridor.  Abdominal:     General: There is no distension.  Musculoskeletal:        General: Tenderness present. No swelling or deformity.     Cervical back: Neck supple.  Skin:    General: Skin is warm and dry.     Findings: No rash.  Neurological:     Mental Status: She is alert. Mental status is at baseline.     Cranial Nerves: No dysarthria or facial asymmetry.     Motor: No seizure activity.     Comments: Normal strength and sensation     ED Results / Procedures / Treatments   Labs (all labs ordered are listed, but only abnormal results are displayed) Labs Reviewed - No data to display  EKG None  Radiology DG Hip Unilat W or Wo Pelvis 2-3 Views Left Result Date: 07/20/2023 CLINICAL DATA:  Hip and back pain, recent cortisone injection EXAM: DG HIP (WITH OR WITHOUT PELVIS) 2-3V LEFT COMPARISON:  None available FINDINGS: Mild diffuse osteopenia. No fracture or dislocation. Mild levoscoliosis with multilevel spondylitic change in the visualized lower lumbar spine. IMPRESSION: 1. No acute findings. 2. Lumbar spondylosis. Electronically Signed   By: Nicoletta Barrier M.D.   On: 07/20/2023 11:43    Procedures Procedures    Medications Ordered in ED Medications  ondansetron (ZOFRAN) injection 4 mg (has no administration in time range)  morphine (PF) 4 MG/ML injection 4 mg (4 mg Intravenous Given 07/20/23 1203)    ED Course/ Medical Decision Making/ A&P Clinical Course as of 07/20/23 1241  [name] 04, 2025  1201 X-ray does not show any acute findings in the hip.  Lumbar spondylosis noted [JK]    Clinical Course User Index [JK] Trish Furl, MD                                 Medical Decision  Making Problems Addressed: Sciatica of left side: acute illness or injury that poses a threat to life or bodily functions  Amount and/or Complexity of Data Reviewed Radiology: ordered and independent interpretation performed.  Risk OTC drugs. Prescription drug management. Parenteral controlled substances.   Patient has history of spinal stenosis.  She recently had an injection but started to [place] new  pain on the left side.  Patient was concerned that this could be related to her hip.  Patient does not have any focal tenderness of the hip.  She is not have any focal tenderness in the knee.  I suspect her pain is related to sciatica possibly related to her spinal stenosis.  She is not having any acute weakness or deficits requiring emergent MRI imaging.  Patient was given medications for pain in the ED with improvement of her symptoms.  Will start her on a course of medications for pain and steroids.  Recommend outpatient follow-up with her spine doctor.        Final Clinical Impression(s) / ED Diagnoses Final diagnoses:  Sciatica of left side    Rx / DC Orders ED Discharge Orders          Ordered    oxyCODONE-acetaminophen (PERCOCET/ROXICET) 5-325 MG tablet  Every 6 hours PRN        07/20/23 1240    predniSONE (DELTASONE) 10 MG tablet  Q breakfast        07/20/23 1240    docusate sodium (COLACE) 100 MG capsule  Every 12 hours        07/20/23 1241              Trish Furl, MD 07/20/23 1242

## 2023-07-20 NOTE — Discharge Instructions (Signed)
 Take the medications as prescribed to help with the pain.  I also prescribed a short course of oral steroids that can sometimes help with sciatica .  The stool softener medication is to help prevent constipation associated with the oxycodone.  Follow-up with your spine doctor as we discussed for further evaluation

## 2023-07-20 NOTE — ED Notes (Signed)
 Patient transported to X-ray

## 2023-07-20 NOTE — ED Notes (Signed)
 This RN reviewed discharge instructions with patient. She verbalized understanding and denied any further questions. PT well appearing upon discharge and reports no pain. Pt ambulated with stable gait to exit. Pt endorses ride home.

## 2023-07-30 ENCOUNTER — Ambulatory Visit (HOSPITAL_BASED_OUTPATIENT_CLINIC_OR_DEPARTMENT_OTHER): Attending: Internal Medicine | Admitting: Physical Therapy

## 2023-07-30 ENCOUNTER — Other Ambulatory Visit: Payer: Self-pay

## 2023-07-30 ENCOUNTER — Encounter (HOSPITAL_BASED_OUTPATIENT_CLINIC_OR_DEPARTMENT_OTHER): Payer: Self-pay | Admitting: Physical Therapy

## 2023-07-30 DIAGNOSIS — M6281 Muscle weakness (generalized): Secondary | ICD-10-CM | POA: Diagnosis present

## 2023-07-30 DIAGNOSIS — M5459 Other low back pain: Secondary | ICD-10-CM | POA: Diagnosis present

## 2023-07-30 DIAGNOSIS — M25561 Pain in right knee: Secondary | ICD-10-CM | POA: Diagnosis present

## 2023-07-30 DIAGNOSIS — R2681 Unsteadiness on feet: Secondary | ICD-10-CM | POA: Insufficient documentation

## 2023-07-30 DIAGNOSIS — G8929 Other chronic pain: Secondary | ICD-10-CM | POA: Diagnosis present

## 2023-07-30 NOTE — Therapy (Signed)
 OUTPATIENT PHYSICAL THERAPY THORACOLUMBAR EVALUATION   Patient Name: Autumn Paul MRN: [id number] DOB:02-03-1946, 78 y.o., female Today's Date: 07/30/2023  END OF SESSION:  PT End of Session - 07/30/23 1054     Visit Number 1    Number of Visits 16    Date for PT Re-Evaluation 09/26/23    Authorization Type [employer] Mc[employer]    Progress Note Due on Visit 10    PT Start Time 0932    PT Stop Time 1015    PT Time Calculation (min) 43 min    Activity Tolerance Patient tolerated treatment well    Behavior During Therapy WFL for tasks assessed/performed             Past Medical History:  Diagnosis Date   Fibromyalgia    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    uses Timolol   Hyperlipidemia    Osteoporosis    Spinal stenosis    Past Surgical History:  Procedure Laterality Date   CARPAL TUNNEL RELEASE     right wrist   CATARACT EXTRACTION     COLONOSCOPY     RHINOPLASTY     TUBAL LIGATION     Patient Active Problem List   Diagnosis Date Noted   After cataract of left eye not obscuring vision 07/26/2014   Glaucoma suspect, both eyes 08/02/2013   Conjunctivochalasis 04/07/2012   Dermatochalasis 04/07/2012   Dry eyes 04/07/2012   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   Meibomian gland dysfunction 04/07/2012   Myopia with astigmatism and presbyopia 04/07/2012   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   Posterior vitreous detachment 04/07/2012   Vitreous syneresis 04/07/2012    PCP: Pecolia Bourbon MD  REFERRING PROVIDER:   Virl Grimes, MD    REFERRING DIAG: M54.50 (ICD-10-CM) - Low back pain, unspecified   Rationale for Evaluation and Treatment: Rehabilitation  THERAPY DIAG:  Other low back pain  Muscle weakness (generalized)  Chronic pain of right knee  Unsteadiness on feet  ONSET DATE: exacerbating for last 6 months  SUBJECTIVE:                                                                                                                                                                                            SUBJECTIVE STATEMENT: I have had back problems for years.  Have bilateral knee pain need an OA.  Saw Dr Jackee Marus for my back and he said I was not a surgical candidate.  I also saw Dr Vaughn Georges but didn't really think he did much.  I do have an appt with a nuero guy Dr Rochelle Chu next week.  Not sure  which one is making the other worse knee vs back.  Had knee injections  which help some but the back injection didn't help.  Stopped with exercising 4 x a week back 6 months ago due to back pain  PERTINENT HISTORY:  history of hyperlipidemia glaucoma, fibromyalgia, spinal stenosis. With recent spinal injection.  Went to ER 5/4 for LBP with sciatica  PAIN:  Are you having pain? Yes: NPRS scale: LBP Pain location: current 5/10; walking 7/10;  Pain description: LB with radiation to left mid calf Aggravating factors: standing/weight bearing; working in garden 30 min Relieving factors: sitting, oxycodone   PAIN:  Are you having pain? Yes: NPRS scale: 0/10 none today Pain location: knees    PRECAUTIONS: None  RED FLAGS: None   WEIGHT BEARING RESTRICTIONS: No  FALLS:  Has patient fallen in last 6 months? No  LIVING ENVIRONMENT: Lives with: lives with their spouse Lives in: House/apartment  OCCUPATION: retired  PLOF: Independent  PATIENT GOALS: decrease pain, walk longer  NEXT MD VISIT: Dr Rochelle Chu Next week  OBJECTIVE:  Note: Objective measures were completed at Evaluation unless otherwise noted.  DIAGNOSTIC FINDINGS:  X-ray hip MPRESSION: 1. No acute findings. 2. Lumbar spondylosis.  PATIENT SURVEYS:  22/50=44%  COGNITION: Overall cognitive status: Within functional limits for tasks assessed      MUSCLE LENGTH: Hamstrings: tested in sitting tight R>L   POSTURE: rounded shoulders, forward head, decreased lumbar lordosis, and weight shift left  PALPATION: TTP throughout lower thoracic and lumbar pain left  glut. Crepitus bilat patella  LUMBAR ROM:   AROM eval  Flexion full  Extension Neutral P!  Right lateral flexion Full P!  Left lateral flexion Full P!  Right rotation   Left rotation    (Blank rows = not tested)  LOWER EXTREMITY ROM:     wfl  LOWER EXTREMITY MMT:    HD (lbs) Tested in sitting Right eval Left eval  Hip flexion 32.7 33.2  Hip extension    Hip abduction 23.0 21.2  Hip adduction    Hip internal rotation    Hip external rotation    Knee flexion    Knee extension 34.2 22.5  Ankle dorsiflexion    Ankle plantarflexion    Ankle inversion    Ankle eversion     (Blank rows = not tested)  LUMBAR SPECIAL TESTS:  Slump test: Negative  FUNCTIONAL TESTS:  5 times sit to stand: 20.96 Timed up and go (TUG): 19.04  BERG Balance Test          Date:   Sit to Stand 3  Standing unsupported 4  Sitting with back unsupported but feet supported 4  Stand to sit  2  Transfers  3  Standing unsupported with eyes closed 2  Standing unsupported feet together 3  From standing position, reach forward with outstretched arm 3  From standing position, pick up object from floor 3  From standing position, turn and look behind over each shoulder 2  Turn 360 2  Standing unsupported, alternately place foot on step 1  Standing unsupported, one foot in front 0  Standing on one leg 0  Total:  32/56     GAIT: Distance walked: 400 ft Assistive device utilized: None Level of assistance: Complete Independence Comments: Increased knee flex/step height (march like), trunk guarding and antalgic limp through LLE  TREATMENT  Eval Self care: difference in MD's limiting Mds to 1 per field, progressing exercise at [facility]; safety with gardening.  Use of heat Testing: Randye Buttner; Tug; 5x STS   PATIENT EDUCATION:  Education details: Discussed eval findings, rehab rationale,  aquatic program progression/POC and pools in area. Patient is in agreement   Person educated: Patient Education method: Chief Technology Officer Education comprehension: verbalized understanding  HOME EXERCISE PROGRAM: tba  ASSESSMENT:  CLINICAL IMPRESSION: Patient is a 78 y.o. f who was seen today for physical therapy evaluation and treatment for LBP.  Pt presents with gait deviation due to pain in bilateral knees (R>L) and LBP left lumbar area with radicular pattern into left leg to knee.  She demonstrates a strength deficit and is a high fall risk as per Randye Buttner.  She has been told she is not a surgical candidate for back but is for a TKR which she has been delaying in attempts to avoid. She has seen many Mds of the same specialty trying to get a consensus of forward treatment on LB but has not had any success other than being referred over to aquatic PT. She does have an appt to see her neurologist next week. She is a good candidate for skill PT intervention both aquatic and land to progress her toward improvement in functional mobility and Adl's as well as decreasing fall risk.   OBJECTIVE IMPAIRMENTS: Abnormal gait, decreased activity tolerance, decreased balance, decreased knowledge of use of DME, decreased mobility, decreased ROM, decreased strength, and pain.   ACTIVITY LIMITATIONS: lifting, bending, squatting, sleeping, stairs, transfers, and locomotion level  PARTICIPATION LIMITATIONS: meal prep, cleaning, shopping, community activity, and yard work  PERSONAL FACTORS: Time since onset of injury/illness/exacerbation and 1-2 comorbidities: see PmHx are also affecting patient's functional outcome.   REHAB POTENTIAL: Good  CLINICAL DECISION MAKING: Evolving/moderate complexity  EVALUATION COMPLEXITY: Moderate   GOALS: Goals reviewed with patient? Yes  SHORT TERM GOALS: Target date: 08/21/23  Pt will tolerate full aquatic sessions consistently without increase in pain and with  improving function to demonstrate good toleration and effectiveness of intervention.  Baseline: Goal status: INITIAL  2.  Pt will return to regular exercise weekly Baseline:  Goal status: INITIAL  3.  Pt will tolerate stair climbing using  combination of alternating and step to pattern ascending and descending 6 steps without use of handrail  Baseline:  Goal status: INITIAL  4.  Pt will report good toleration to gardening without excessive pain Baseline:  Goal status: INITIAL    LONG TERM GOALS: Target date: 09/26/23  Pt to improve on ODI by 13-15% to demonstrate statistically significant Improvement in function. Baseline: 22/50=44% Goal status: INITIAL  2.  Pt will be indep with final HEP's (land and aquatic as appropriate) for continued management of condition Baseline:  Goal status: INITIAL  3.  Pt will improve strength in hips by at least 5 lbs to demonstrate improved overall physical function Baseline:  Goal status: INITIAL  4.  Pt will improve on Berg balance test to >/= 45/56 to demonstrate a decrease in fall risk. Baseline:32/56  Goal status: INITIAL  5.  Pt will report decrease in pain by at least 50% for improved toleration to activity/quality of life and to demonstrate improved management of pain. Baseline: see chart Goal status: INITIAL  PLAN:  PT FREQUENCY: 2x/week  PT DURATION: 8 weeks  first 6 pool only then remainder alternating land and water for optimal outcome  PLANNED INTERVENTIONS: [id number]- PT Re-evaluation, 97110-Therapeutic exercises, 97530-  Therapeutic activity, [id number]- Neuromuscular re-education, [phone]- Self Care, [id number]- Manual therapy, [id number]- Gait training, [phone]- Orthotic Initial, [phone]- Aquatic Therapy, [phone]- Ionotophoresis 4mg /ml Dexamethasone , Patient/Family education, Balance training, Stair training, Taping, Dry Needling, Joint mobilization, DME instructions, Cryotherapy, and Moist heat.  PLAN FOR NEXT SESSION: Land/aquatic: le and core  strengthening, gait training, balance retraining, transitional movements/STS transfers; activity toleration. Use of AD as approp   Adriana Hopping Laneta Pintos) Patra Gherardi MPT 07/30/23 10:59 AM [facility] [address] [place], [place], [address] Phone: [phone]   Fax:  [phone]  Date of referral: 06/19/23 Referring provider: [facility] Referring diagnosis? LBP Treatment diagnosis? (if different than referring diagnosis) LBP and Knee pain  What was this (referring dx) caused by? Ongoing Issue and Arthritis  Lonne Roan of Condition: Chronic (continuous duration > 3 months)   Laterality: Both  Current Functional Measure Score: Other ODI 44%  Objective measurements identify impairments when they are compared to normal values, the uninvolved extremity, and prior level of function.  [x]  Yes  []  No  Objective assessment of functional ability: Moderate functional limitations   Briefly describe symptoms: Pain lb radicularr pattern into lle; right knee pain  How did symptoms start: chronic  Average pain intensity:  Last 24 hours: 7/10  Past week: 7/10  How often does the pt experience symptoms? Constantly  How much have the symptoms interfered with usual daily activities? Quite a bit  How has condition changed since care began at this facility? NA - initial visit  In general, how is the patients overall health? Good   BACK PAIN (STarT Back Screening Tool) Has pain spread down the leg(s) at some time in the last 2 weeks? yes Has the pt only walked short distances because of back pain? yes Does patient think it's not safe for a person with this condition to be physically active? yes Does patient feel back pain is terrible and will never get any better? yes Has patient stopped enjoying things they usually enjoy? yes

## 2023-08-06 ENCOUNTER — Encounter (HOSPITAL_BASED_OUTPATIENT_CLINIC_OR_DEPARTMENT_OTHER): Payer: Self-pay | Admitting: Physical Therapy

## 2023-08-06 ENCOUNTER — Encounter (HOSPITAL_BASED_OUTPATIENT_CLINIC_OR_DEPARTMENT_OTHER): Payer: Self-pay

## 2023-08-06 ENCOUNTER — Ambulatory Visit (HOSPITAL_BASED_OUTPATIENT_CLINIC_OR_DEPARTMENT_OTHER): Admitting: Physical Therapy

## 2023-08-06 NOTE — Therapy (Incomplete)
 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    uses Timolol   Hyperlipidemia    Osteoporosis    Spinal stenosis    Past Surgical History:  Procedure Laterality Date   CARPAL TUNNEL RELEASE     right wrist   CATARACT EXTRACTION     COLONOSCOPY     RHINOPLASTY     TUBAL LIGATION     Patient Active Problem List   Diagnosis Date Noted   After cataract of left eye not obscuring vision 07/26/2014   Glaucoma suspect, both eyes 08/02/2013   Conjunctivochalasis 04/07/2012   Dermatochalasis 04/07/2012   Dry eyes 04/07/2012   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   Meibomian gland dysfunction 04/07/2012   Myopia with astigmatism and presbyopia 04/07/2012   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  ONSET DATE: exacerbating for last 6 months  SUBJECTIVE:                                                                                                                                                                                           SUBJECTIVE STATEMENT:  Initial Subjective I have had back problems for years.  Have bilateral knee pain need an OA.  Saw Dr Jackee Marus for my back and he said I was not a surgical candidate.  I also saw Dr Vaughn Georges but didn't really think he did much.  I do have an appt with a nuero guy Dr Rochelle Chu next week.  Not sure which one is making the other worse knee vs back.  Had knee injections  which help some but  the back injection didn't help.  Stopped with exercising 4 x a week back 6 months ago due to back pain  PERTINENT HISTORY:  history of hyperlipidemia glaucoma, fibromyalgia, spinal stenosis. With recent spinal injection.  Went to ER 5/4 for LBP with sciatica  PAIN:  Are you having pain? Yes: NPRS scale: LBP Pain location: current 5/10; walking 7/10;  Pain description: LB with radiation to left mid calf Aggravating factors: standing/weight bearing; working in garden 30 min Relieving factors: sitting, oxycodone   PAIN:  Are you  having pain? Yes: NPRS scale: 0/10 none today Pain location: knees    PRECAUTIONS: None  RED FLAGS: None   WEIGHT BEARING RESTRICTIONS: No  FALLS:  Has patient fallen in last 6 months? No  LIVING ENVIRONMENT: Lives with: lives with their spouse Lives in: House/apartment  OCCUPATION: retired  PLOF: Independent  PATIENT GOALS: decrease pain, walk longer  NEXT MD VISIT: Dr Rochelle Chu Next week  OBJECTIVE:  Note: Objective measures were completed at Evaluation unless otherwise noted.  DIAGNOSTIC FINDINGS:  X-ray hip MPRESSION: 1. No acute findings. 2. Lumbar spondylosis.  PATIENT SURVEYS:  22/50=44%  COGNITION: Overall cognitive status: Within functional limits for tasks assessed      MUSCLE LENGTH: Hamstrings: tested in sitting tight R>L   POSTURE: rounded shoulders, forward head, decreased lumbar lordosis, and weight shift left  PALPATION: TTP throughout lower thoracic and lumbar pain left glut. Crepitus bilat patella  LUMBAR ROM:   AROM eval  Flexion full  Extension Neutral P!  Right lateral flexion Full P!  Left lateral flexion Full P!  Right rotation   Left rotation    (Blank rows = not tested)  LOWER EXTREMITY ROM:     wfl  LOWER EXTREMITY MMT:    HD (lbs) Tested in sitting Right eval Left eval  Hip flexion 32.7 33.2  Hip extension    Hip abduction 23.0 21.2  Hip adduction    Hip internal rotation    Hip  external rotation    Knee flexion    Knee extension 34.2 22.5  Ankle dorsiflexion    Ankle plantarflexion    Ankle inversion    Ankle eversion     (Blank rows = not tested)  LUMBAR SPECIAL TESTS:  Slump test: Negative  FUNCTIONAL TESTS:  5 times sit to stand: 20.96 Timed up and go (TUG): 19.04  BERG Balance Test          Date:   Sit to Stand 3  Standing unsupported 4  Sitting with back unsupported but feet supported 4  Stand to sit  2  Transfers  3  Standing unsupported with eyes closed 2  Standing unsupported feet together 3  From standing position, reach forward with outstretched arm 3  From standing position, pick up object from floor 3  From standing position, turn and look behind over each shoulder 2  Turn 360 2  Standing unsupported, alternately place foot on step 1  Standing unsupported, one foot in front 0  Standing on one leg 0  Total:  32/56     GAIT: Distance walked: 400 ft Assistive device utilized: None Level of assistance: Complete Independence Comments: Increased knee flex/step height (march like), trunk guarding and antalgic limp through LLE  TREATMENT                                                                                                                         [facility] Adult PT Treatment:  DATE: 08/06/23 Pt seen for aquatic therapy today.  Treatment took place in water 3.5-4.75 ft in depth at the [facility] pool. Temp of water was 91.  Pt entered/exited the pool via stairs and step to pattern with hand rail.  *Intro to setting *walking forward, back and side stepping in 3.6 ft with ue support of *L stretch *3 way hamstring stretch *Ue support on wall: toe raises; heel raises; hip add/abd; hip extension; relaxed squats *seated on lift: cycling; hip add/abd; LAQ *decompression position with noodle wrapped     across chest  Pt requires the buoyancy and hydrostatic pressure of water for  support, and to offload joints by unweighting joint load by at least 50 % in navel deep water and by at least 75-80% in chest to neck deep water.  Viscosity of the water is needed for resistance of strengthening. Water current perturbations provides challenge to standing balance requiring increased core activation.      PATIENT EDUCATION:  Education details: Discussed eval findings, rehab rationale, aquatic program progression/POC and pools in area. Patient is in agreement   Person educated: Patient Education method: Chief Technology Officer Education comprehension: verbalized understanding  HOME EXERCISE PROGRAM: tba  ASSESSMENT:  CLINICAL IMPRESSION: Pt demonstrates safety and independence in aquatic setting with therapist instructing from deck. Pt demonstrates confidence in setting, moving throughout all depths easily.  Pt is directed through various movement patterns and trials in both sitting and standing positions. ***  Goals are ongoing.      Initial Impression Patient is a 78 y.o. f who was seen today for physical therapy evaluation and treatment for LBP.  Pt presents with gait deviation due to pain in bilateral knees (R>L) and LBP left lumbar area with radicular pattern into left leg to knee.  She demonstrates a strength deficit and is a high fall risk as per Randye Buttner.  She has been told she is not a surgical candidate for back but is for a TKR which she has been delaying in attempts to avoid. She has seen many Mds of the same specialty trying to get a consensus of forward treatment on LB but has not had any success other than being referred over to aquatic PT. She does have an appt to see her neurologist next week. She is a good candidate for skill PT intervention both aquatic and land to progress her toward improvement in functional mobility and Adl's as well as decreasing fall risk.   OBJECTIVE IMPAIRMENTS: Abnormal gait, decreased activity tolerance, decreased balance, decreased  knowledge of use of DME, decreased mobility, decreased ROM, decreased strength, and pain.   ACTIVITY LIMITATIONS: lifting, bending, squatting, sleeping, stairs, transfers, and locomotion level  PARTICIPATION LIMITATIONS: meal prep, cleaning, shopping, community activity, and yard work  PERSONAL FACTORS: Time since onset of injury/illness/exacerbation and 1-2 comorbidities: see PmHx are also affecting patient's functional outcome.   REHAB POTENTIAL: Good  CLINICAL DECISION MAKING: Evolving/moderate complexity  EVALUATION COMPLEXITY: Moderate   GOALS: Goals reviewed with patient? Yes  SHORT TERM GOALS: Target date: 08/21/23  Pt will tolerate full aquatic sessions consistently without increase in pain and with improving function to demonstrate good toleration and effectiveness of intervention.  Baseline: Goal status: INITIAL  2.  Pt will return to regular exercise weekly Baseline:  Goal status: INITIAL  3.  Pt will tolerate stair climbing using  combination of alternating and step to pattern ascending and descending 6 steps without use of handrail  Baseline:  Goal status: INITIAL  4.  Pt will report good toleration to gardening without excessive pain Baseline:  Goal status: INITIAL    LONG TERM GOALS: Target date: 09/26/23  Pt to improve on ODI by 13-15% to demonstrate statistically significant Improvement in function. Baseline: 22/50=44% Goal status: INITIAL  2.  Pt will be indep with final HEP's (land and aquatic as appropriate) for continued management of condition Baseline:  Goal status: INITIAL  3.  Pt will improve strength in hips by at least 5 lbs to demonstrate improved overall physical function Baseline:  Goal status: INITIAL  4.  Pt will improve on Berg balance test to >/= 45/56 to demonstrate a decrease in fall risk. Baseline:32/56  Goal status: INITIAL  5.  Pt will report decrease in pain by at least 50% for improved toleration to activity/quality of life  and to demonstrate improved management of pain. Baseline: see chart Goal status: INITIAL  PLAN:  PT FREQUENCY: 2x/week  PT DURATION: 8 weeks  first 6 pool only then remainder alternating land and water for optimal outcome  PLANNED INTERVENTIONS: [id number]- PT Re-evaluation, 97110-Therapeutic exercises, 97530- Therapeutic activity, 97112- Neuromuscular re-education, 97535- Self Care, 09811- Manual therapy, [id number]- Gait training, [phone]- Orthotic Initial, [phone]- Aquatic Therapy, [phone]- Ionotophoresis 4mg /ml Dexamethasone , Patient/Family education, Balance training, Stair training, Taping, Dry Needling, Joint mobilization, DME instructions, Cryotherapy, and Moist heat.  PLAN FOR NEXT SESSION: Land/aquatic: le and core strengthening, gait training, balance retraining, transitional movements/STS transfers; activity toleration. Use of AD as approp   Adriana Hopping Laneta Pintos) Bonny Vanleeuwen MPT 08/06/23 10:22 AM [facility] [address] [place], [place], [address] Phone: [phone]   Fax:  [phone]  Date of referral: 06/19/23 Referring provider: [facility] Referring diagnosis? LBP Treatment diagnosis? (if different than referring diagnosis) LBP and Knee pain  What was this (referring dx) caused by? Ongoing Issue and Arthritis  Lonne Roan of Condition: Chronic (continuous duration > 3 months)   Laterality: Both  Current Functional Measure Score: Other ODI 44%  Objective measurements identify impairments when they are compared to normal values, the uninvolved extremity, and prior level of function.  [x]  Yes  []  No  Objective assessment of functional ability: Moderate functional limitations   Briefly describe symptoms: Pain lb radicularr pattern into lle; right knee pain  How did symptoms start: chronic  Average pain intensity:  Last 24 hours: 7/10  Past week: 7/10  How often does the pt experience symptoms? Constantly  How much have the symptoms  interfered with usual daily activities? Quite a bit  How has condition changed since care began at this facility? NA - initial visit  In general, how is the patients overall health? Good   BACK PAIN (STarT Back Screening Tool) Has pain spread down the leg(s) at some time in the last 2 weeks? yes Has the pt only walked short distances because of back pain? yes Does patient think it's not safe for a person with this condition to be physically active? yes Does patient feel back pain is terrible and will never get any better? yes Has patient stopped enjoying things they usually enjoy? yes

## 2023-08-12 ENCOUNTER — Ambulatory Visit (HOSPITAL_BASED_OUTPATIENT_CLINIC_OR_DEPARTMENT_OTHER): Payer: Self-pay | Admitting: Physical Therapy

## 2023-08-20 ENCOUNTER — Encounter (HOSPITAL_BASED_OUTPATIENT_CLINIC_OR_DEPARTMENT_OTHER): Payer: Self-pay | Admitting: Physical Therapy

## 2023-08-20 ENCOUNTER — Ambulatory Visit (HOSPITAL_BASED_OUTPATIENT_CLINIC_OR_DEPARTMENT_OTHER): Attending: Internal Medicine | Admitting: Physical Therapy

## 2023-08-20 DIAGNOSIS — R2681 Unsteadiness on feet: Secondary | ICD-10-CM | POA: Diagnosis present

## 2023-08-20 DIAGNOSIS — M5459 Other low back pain: Secondary | ICD-10-CM | POA: Diagnosis present

## 2023-08-20 DIAGNOSIS — M6281 Muscle weakness (generalized): Secondary | ICD-10-CM | POA: Insufficient documentation

## 2023-08-20 DIAGNOSIS — G8929 Other chronic pain: Secondary | ICD-10-CM | POA: Diagnosis present

## 2023-08-20 DIAGNOSIS — M25561 Pain in right knee: Secondary | ICD-10-CM | POA: Diagnosis present

## 2023-08-20 NOTE — Therapy (Signed)
 OUTPATIENT PHYSICAL THERAPY THORACOLUMBAR TREATMENT   Patient Name: Autumn Paul MRN: [id number] DOB:11-Mar-1946, 78 y.o., female Today's Date: 08/20/2023  END OF SESSION:  PT End of Session - 08/20/23 0924     Visit Number 2    Number of Visits 16    Date for PT Re-Evaluation 09/26/23    Authorization Type [employer]    Authorization Time Period 07/30/23-09/24/23    Authorization - Visit Number 2    Authorization - Number of Visits 6    PT Start Time 0927    PT Stop Time 1005    PT Time Calculation (min) 38 min    Activity Tolerance Patient tolerated treatment well    Behavior During Therapy WFL for tasks assessed/performed             Past Medical History:  Diagnosis Date   Fibromyalgia    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    uses Timolol   Hyperlipidemia    Osteoporosis    Spinal stenosis    Past Surgical History:  Procedure Laterality Date   CARPAL TUNNEL RELEASE     right wrist   CATARACT EXTRACTION     COLONOSCOPY     RHINOPLASTY     TUBAL LIGATION     Patient Active Problem List   Diagnosis Date Noted   After cataract of left eye not obscuring vision 07/26/2014   Glaucoma suspect, both eyes 08/02/2013   Conjunctivochalasis 04/07/2012   Dermatochalasis 04/07/2012   Dry eyes 04/07/2012   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   Meibomian gland dysfunction 04/07/2012   Myopia with astigmatism and presbyopia 04/07/2012   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   Posterior vitreous detachment 04/07/2012   Vitreous syneresis 04/07/2012    PCP: Pecolia Bourbon MD  REFERRING PROVIDER:   Virl Grimes, MD    REFERRING DIAG: M54.50 (ICD-10-CM) - Low back pain, unspecified   Rationale for Evaluation and Treatment: Rehabilitation  THERAPY DIAG:  Other low back pain  Muscle weakness (generalized)  Chronic pain of right knee  Unsteadiness on feet  ONSET DATE: exacerbating for last 6 months  SUBJECTIVE:                                                                                                                                                                                            SUBJECTIVE STATEMENT: She is scheduled for MRI of back on 6/10 and returns to see Dr. Rochelle Chu on 6/24.  Just returned from trip to [place], went well, but pain in back increased after "tending to the flowers in garden" yesterday.  POOL ACCESS: considering joining [employer].  From evaluation:  I have had back problems for years.  Have bilateral knee pain need an OA.  Saw Dr Jackee Marus for my back and he said I was not a surgical candidate.  I also saw Dr Vaughn Georges but didn't really think he did much.  I do have an appt with a nuero guy Dr Rochelle Chu next week.  Not sure which one is making the other worse knee vs back.  Had knee injections  which help some but the back injection didn't help.  Stopped with exercising 4 x a week back 6 months ago due to back pain  PERTINENT HISTORY:  history of hyperlipidemia glaucoma, fibromyalgia, spinal stenosis. With recent spinal injection.  Went to ER 5/4 for LBP with sciatica  PAIN:  Are you having pain? Yes: NPRS scale: 2/10 Pain location: lower back into buttocks and bilat knees Pain description: achy Aggravating factors: standing/weight bearing; working in garden 30 min Relieving factors: sitting, oxycodone     PRECAUTIONS: None  RED FLAGS: None   WEIGHT BEARING RESTRICTIONS: No  FALLS:  Has patient fallen in last 6 months? No  LIVING ENVIRONMENT: Lives with: lives with their spouse Lives in: House/apartment  OCCUPATION: retired  PLOF: Independent  PATIENT GOALS: decrease pain, walk longer  NEXT MD VISIT: Dr Rochelle Chu Next week  OBJECTIVE:  Note: Objective measures were completed at Evaluation unless otherwise noted.  DIAGNOSTIC FINDINGS:  X-ray hip MPRESSION: 1. No acute findings. 2. Lumbar spondylosis.  PATIENT SURVEYS:  22/50=44%  COGNITION: Overall cognitive status: Within functional  limits for tasks assessed      MUSCLE LENGTH: Hamstrings: tested in sitting tight R>L   POSTURE: rounded shoulders, forward head, decreased lumbar lordosis, and weight shift left  PALPATION: TTP throughout lower thoracic and lumbar pain left glut. Crepitus bilat patella  LUMBAR ROM:   AROM eval  Flexion full  Extension Neutral P!  Right lateral flexion Full P!  Left lateral flexion Full P!  Right rotation   Left rotation    (Blank rows = not tested)  LOWER EXTREMITY ROM:     wfl  LOWER EXTREMITY MMT:    HD (lbs) Tested in sitting Right eval Left eval  Hip flexion 32.7 33.2  Hip extension    Hip abduction 23.0 21.2  Hip adduction    Hip internal rotation    Hip external rotation    Knee flexion    Knee extension 34.2 22.5  Ankle dorsiflexion    Ankle plantarflexion    Ankle inversion    Ankle eversion     (Blank rows = not tested)  LUMBAR SPECIAL TESTS:  Slump test: Negative  FUNCTIONAL TESTS:  5 times sit to stand: 20.96 Timed up and go (TUG): 19.04  BERG Balance Test          Date: eval  Sit to Stand 3  Standing unsupported 4  Sitting with back unsupported but feet supported 4  Stand to sit  2  Transfers  3  Standing unsupported with eyes closed 2  Standing unsupported feet together 3  From standing position, reach forward with outstretched arm 3  From standing position, pick up object from floor 3  From standing position, turn and look behind over each shoulder 2  Turn 360 2  Standing unsupported, alternately place foot on step 1  Standing unsupported, one foot in front 0  Standing on one leg 0  Total:  32/56     GAIT: Distance walked: 400 ft Assistive device utilized: None Level  of assistance: Complete Independence Comments: Increased knee flex/step height (march like), trunk guarding and antalgic limp through LLE  TREATMENT                                                                                                                          [facility] Adult PT Treatment:                                             08/20/23 Pt seen for aquatic therapy today.  Treatment took place in water 3.5-4.75 ft in depth at the [facility] pool. Temp of water was 91.  Pt entered/exited the pool via stairs independently with bilat rail.  - review of aquatic therapy principles - unsupported walking forward/ backward - cues for even step length - side stepping -> with arm addct/ abdct ->with rainbow hand floats - suitcase carry marching backward/forward with bilat rainbow and single yellow hand floats at side (balance challenge with single yellow) - UE on wall:  toe/heel raises x 10 ; hip add/abd 2 x 10, cues to slow speed, engage core and reduce height of LE ; hip flexion /extension to touch toe x 10 cues to knee knee straight (limited tolerance on LLE); relaxed squats x 10 - TrA set with 1/2 -> full hollow noodle pull down to thighs in wide then staggered stance - forward walking kicks - bilat gastroc stretch with heels off of step x 20s - R/L hamstring stretch with foot on 2nd step, 15s x 2 each LE  Pt requires the buoyancy and hydrostatic pressure of water for support, and to offload joints by unweighting joint load by at least 50 % in navel deep water and by at least 75-80% in chest to neck deep water.  Viscosity of the water is needed for resistance of strengthening. Water current perturbations provides challenge to standing balance requiring increased core activation.      PATIENT EDUCATION:  Education details: reacquainting with aquatic therapy  Person educated: Patient Education method: Chief Technology Officer Education comprehension: verbalized understanding  HOME EXERCISE PROGRAM: tba  ASSESSMENT:  CLINICAL IMPRESSION: Pt demonstrates safety and independence in aquatic setting with therapist instructing from deck. Pt demonstrates confidence in setting, moving throughout all depths easily; she is familiar with  setting from previous episode of care in 2022.  Pt is directed through various movement patterns standing position. She required some cues for more even step length and neutral R foot position (vs ER). Occasional cues to slow the speed of movement and control height of LEs. Slight reduction in pain during session.    Goals are ongoing.     From initial evaluation:  Patient is a 78 y.o. f who was seen today for physical therapy evaluation and treatment for LBP.  Pt presents with gait deviation due to pain in bilateral knees (R>L) and LBP  left lumbar area with radicular pattern into left leg to knee.  She demonstrates a strength deficit and is a high fall risk as per Randye Buttner.  She has been told she is not a surgical candidate for back but is for a TKR which she has been delaying in attempts to avoid. She has seen many Mds of the same specialty trying to get a consensus of forward treatment on LB but has not had any success other than being referred over to aquatic PT. She does have an appt to see her neurologist next week. She is a good candidate for skill PT intervention both aquatic and land to progress her toward improvement in functional mobility and Adl's as well as decreasing fall risk.   OBJECTIVE IMPAIRMENTS: Abnormal gait, decreased activity tolerance, decreased balance, decreased knowledge of use of DME, decreased mobility, decreased ROM, decreased strength, and pain.   ACTIVITY LIMITATIONS: lifting, bending, squatting, sleeping, stairs, transfers, and locomotion level  PARTICIPATION LIMITATIONS: meal prep, cleaning, shopping, community activity, and yard work  PERSONAL FACTORS: Time since onset of injury/illness/exacerbation and 1-2 comorbidities: see PmHx are also affecting patient's functional outcome.   REHAB POTENTIAL: Good  CLINICAL DECISION MAKING: Evolving/moderate complexity  EVALUATION COMPLEXITY: Moderate   GOALS: Goals reviewed with patient? Yes  SHORT TERM GOALS: Target  date: 08/21/23  Pt will tolerate full aquatic sessions consistently without increase in pain and with improving function to demonstrate good toleration and effectiveness of intervention.  Baseline: Goal status: IN PROGRESS -08/20/23  2.  Pt will return to regular exercise weekly Baseline:  Goal status: INITIAL  3.  Pt will tolerate stair climbing using  combination of alternating and step to pattern ascending and descending 6 steps without use of handrail  Baseline:  Goal status: INITIAL  4.  Pt will report good toleration to gardening without excessive pain Baseline:  Goal status: INITIAL    LONG TERM GOALS: Target date: 09/26/23  Pt to improve on ODI by 13-15% to demonstrate statistically significant Improvement in function. Baseline: 22/50=44% Goal status: INITIAL  2.  Pt will be indep with final HEP's (land and aquatic as appropriate) for continued management of condition Baseline:  Goal status: INITIAL  3.  Pt will improve strength in hips by at least 5 lbs to demonstrate improved overall physical function Baseline:  Goal status: INITIAL  4.  Pt will improve on Berg balance test to >/= 45/56 to demonstrate a decrease in fall risk. Baseline:32/56  Goal status: INITIAL  5.  Pt will report decrease in pain by at least 50% for improved toleration to activity/quality of life and to demonstrate improved management of pain. Baseline: see chart Goal status: INITIAL  PLAN:  PT FREQUENCY: 2x/week  PT DURATION: 8 weeks  first 6 pool only then remainder alternating land and water for optimal outcome  PLANNED INTERVENTIONS: [id number]- PT Re-evaluation, 97110-Therapeutic exercises, 97530- Therapeutic activity, 97112- Neuromuscular re-education, 97535- Self Care, 78469- Manual therapy, [id number]- Gait training, [phone]- Orthotic Initial, [phone]- Aquatic Therapy, [phone]- Ionotophoresis 4mg /ml Dexamethasone , Patient/Family education, Balance training, Stair training, Taping, Dry Needling, Joint  mobilization, DME instructions, Cryotherapy, and Moist heat.  PLAN FOR NEXT SESSION: Land/aquatic: le and core strengthening, gait training, balance retraining, transitional movements/STS transfers; activity toleration. Use of AD as approp   Almedia Jacobsen, PTA 08/20/23 11:14 AM [facility] [address] [place], [place], [address] Phone: [phone]   Fax:  [phone]   Date of referral: 06/19/23 Referring provider: [facility] Referring diagnosis? LBP Treatment  diagnosis? (if different than referring diagnosis) LBP and Knee pain  What was this (referring dx) caused by? Ongoing Issue and Arthritis  Lonne Roan of Condition: Chronic (continuous duration > 3 months)   Laterality: Both  Current Functional Measure Score: Other ODI 44%  Objective measurements identify impairments when they are compared to normal values, the uninvolved extremity, and prior level of function.  [x]  Yes  []  No  Objective assessment of functional ability: Moderate functional limitations   Briefly describe symptoms: Pain lb radicularr pattern into lle; right knee pain  How did symptoms start: chronic  Average pain intensity:  Last 24 hours: 7/10  Past week: 7/10  How often does the pt experience symptoms? Constantly  How much have the symptoms interfered with usual daily activities? Quite a bit  How has condition changed since care began at this facility? NA - initial visit  In general, how is the patients overall health? Good   BACK PAIN (STarT Back Screening Tool) Has pain spread down the leg(s) at some time in the last 2 weeks? yes Has the pt only walked short distances because of back pain? yes Does patient think it's not safe for a person with this condition to be physically active? yes Does patient feel back pain is terrible and will never get any better? yes Has patient stopped enjoying things they usually enjoy? yes

## 2023-08-22 ENCOUNTER — Encounter (HOSPITAL_BASED_OUTPATIENT_CLINIC_OR_DEPARTMENT_OTHER): Payer: Self-pay | Admitting: Physical Therapy

## 2023-08-22 ENCOUNTER — Ambulatory Visit (HOSPITAL_BASED_OUTPATIENT_CLINIC_OR_DEPARTMENT_OTHER): Admitting: Physical Therapy

## 2023-08-22 DIAGNOSIS — M6281 Muscle weakness (generalized): Secondary | ICD-10-CM

## 2023-08-22 DIAGNOSIS — G8929 Other chronic pain: Secondary | ICD-10-CM

## 2023-08-22 DIAGNOSIS — R2681 Unsteadiness on feet: Secondary | ICD-10-CM

## 2023-08-22 DIAGNOSIS — M5459 Other low back pain: Secondary | ICD-10-CM

## 2023-08-22 NOTE — Therapy (Signed)
 OUTPATIENT PHYSICAL THERAPY THORACOLUMBAR TREATMENT   Patient Name: Autumn Paul Denno MRN: [id number] DOB:1945/08/24, 78 y.o., female Today's Date: 08/22/2023  END OF SESSION:  PT End of Session - 08/22/23 0945     Visit Number 3    Number of Visits 16    Date for PT Re-Evaluation 09/26/23    Authorization Type [employer]    Authorization Time Period 07/30/23-09/24/23    Authorization - Number of Visits 6    PT Start Time 0931    PT Stop Time 1010    PT Time Calculation (min) 39 min    Activity Tolerance Patient tolerated treatment well    Behavior During Therapy WFL for tasks assessed/performed             Past Medical History:  Diagnosis Date   Fibromyalgia    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    uses Timolol   Hyperlipidemia    Osteoporosis    Spinal stenosis    Past Surgical History:  Procedure Laterality Date   CARPAL TUNNEL RELEASE     right wrist   CATARACT EXTRACTION     COLONOSCOPY     RHINOPLASTY     TUBAL LIGATION     Patient Active Problem List   Diagnosis Date Noted   After cataract of left eye not obscuring vision 07/26/2014   Glaucoma suspect, both eyes 08/02/2013   Conjunctivochalasis 04/07/2012   Dermatochalasis 04/07/2012   Dry eyes 04/07/2012   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   Meibomian gland dysfunction 04/07/2012   Myopia with astigmatism and presbyopia 04/07/2012   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   Posterior vitreous detachment 04/07/2012   Vitreous syneresis 04/07/2012    PCP: Pecolia Bourbon MD  REFERRING PROVIDER:   Virl Grimes, MD    REFERRING DIAG: M54.50 (ICD-10-CM) - Low back pain, unspecified   Rationale for Evaluation and Treatment: Rehabilitation  THERAPY DIAG:  Other low back pain  Muscle weakness (generalized)  Chronic pain of right knee  Unsteadiness on feet  ONSET DATE: exacerbating for last 6 months  SUBJECTIVE:                                                                                                                                                                                            SUBJECTIVE STATEMENT: She reports spinal injection with complete relief from Left sided LBP as well as radicular pain into left calf.  POOL ACCESS: considering joining [employer].   From evaluation:  I have had back problems for years.  Have bilateral knee pain need an OA.  Saw Dr Jackee Marus for my back and he  said I was not a surgical candidate.  I also saw Dr Vaughn Georges but didn't really think he did much.  I do have an appt with a nuero guy Dr Rochelle Chu next week.  Not sure which one is making the other worse knee vs back.  Had knee injections  which help some but the back injection didn't help.  Stopped with exercising 4 x a week back 6 months ago due to back pain  PERTINENT HISTORY:  history of hyperlipidemia glaucoma, fibromyalgia, spinal stenosis. With recent spinal injection.  Went to ER 5/4 for LBP with sciatica  PAIN:  Are you having pain? Yes: NPRS scale: 2/10 Pain location: lower back into buttocks and bilat knees Pain description: achy Aggravating factors: standing/weight bearing; working in garden 30 min Relieving factors: sitting, oxycodone     PRECAUTIONS: None  RED FLAGS: None   WEIGHT BEARING RESTRICTIONS: No  FALLS:  Has patient fallen in last 6 months? No  LIVING ENVIRONMENT: Lives with: lives with their spouse Lives in: House/apartment  OCCUPATION: retired  PLOF: Independent  PATIENT GOALS: decrease pain, walk longer  NEXT MD VISIT: Dr Rochelle Chu Next week  OBJECTIVE:  Note: Objective measures were completed at Evaluation unless otherwise noted.  DIAGNOSTIC FINDINGS:  X-ray hip MPRESSION: 1. No acute findings. 2. Lumbar spondylosis.  PATIENT SURVEYS:  22/50=44%  COGNITION: Overall cognitive status: Within functional limits for tasks assessed      MUSCLE LENGTH: Hamstrings: tested in sitting tight R>L   POSTURE: rounded shoulders,  forward head, decreased lumbar lordosis, and weight shift left  PALPATION: TTP throughout lower thoracic and lumbar pain left glut. Crepitus bilat patella  LUMBAR ROM:   AROM eval  Flexion full  Extension Neutral P!  Right lateral flexion Full P!  Left lateral flexion Full P!  Right rotation   Left rotation    (Blank rows = not tested)  LOWER EXTREMITY ROM:     wfl  LOWER EXTREMITY MMT:    HD (lbs) Tested in sitting Right eval Left eval  Hip flexion 32.7 33.2  Hip extension    Hip abduction 23.0 21.2  Hip adduction    Hip internal rotation    Hip external rotation    Knee flexion    Knee extension 34.2 22.5  Ankle dorsiflexion    Ankle plantarflexion    Ankle inversion    Ankle eversion     (Blank rows = not tested)  LUMBAR SPECIAL TESTS:  Slump test: Negative  FUNCTIONAL TESTS:  5 times sit to stand: 20.96 Timed up and go (TUG): 19.04  BERG Balance Test          Date: eval  Sit to Stand 3  Standing unsupported 4  Sitting with back unsupported but feet supported 4  Stand to sit  2  Transfers  3  Standing unsupported with eyes closed 2  Standing unsupported feet together 3  From standing position, reach forward with outstretched arm 3  From standing position, pick up object from floor 3  From standing position, turn and look behind over each shoulder 2  Turn 360 2  Standing unsupported, alternately place foot on step 1  Standing unsupported, one foot in front 0  Standing on one leg 0  Total:  32/56     GAIT: Distance walked: 400 ft Assistive device utilized: None Level of assistance: Complete Independence Comments: Increased knee flex/step height (march like), trunk guarding and antalgic limp through LLE  TREATMENT  [facility] Adult PT Treatment:                                             08/22/23 Pt seen for aquatic therapy today.   Treatment took place in water 3.5-4.75 ft in depth at the [facility] pool. Temp of water was 91.  Pt entered/exited the pool via stairs independently with bilat rail.  - unsupported walking forward/ backward - cues for even step length - side stepping -> with arm addct/ abdct ->with yellow hand floats - suitcase carry marching backward/forward with bilat yellow and single yellow hand floats at side. Increased widths of single due to good challenge - TrA set with full hollow noodle pull down to thighs in wide then staggered stance - forward walking kicks - UE support yellow HB: df; PF. Cues for core engagement and balance  Pt requires the buoyancy and hydrostatic pressure of water for support, and to offload joints by unweighting joint load by at least 50 % in navel deep water and by at least 75-80% in chest to neck deep water.  Viscosity of the water is needed for resistance of strengthening. Water current perturbations provides challenge to standing balance requiring increased core activation.      PATIENT EDUCATION:  Education details: reacquainting with aquatic therapy  Person educated: Patient Education method: Chief Technology Officer Education comprehension: verbalized understanding  HOME EXERCISE PROGRAM: tba  ASSESSMENT:  CLINICAL IMPRESSION: Pt reports good response to spinal injection yesterday so far eliminating left sided LBP as well as radicular pain into left calf.  Focus today is on standing balance which she is very challenged .  Cues for slowed pacing of activities and core engagement throughout. She does not LOB although is moderately unsteady with all challenges. She reports indep focus on maintaining right ankle/foot more neutral with amb Goals ongoing      From initial evaluation:  Patient is a 78 y.o. f who was seen today for physical therapy evaluation and treatment for LBP.  Pt presents with gait deviation due to pain in bilateral knees (R>L) and  LBP left lumbar area with radicular pattern into left leg to knee.  She demonstrates a strength deficit and is a high fall risk as per Randye Buttner.  She has been told she is not a surgical candidate for back but is for a TKR which she has been delaying in attempts to avoid. She has seen many Mds of the same specialty trying to get a consensus of forward treatment on LB but has not had any success other than being referred over to aquatic PT. She does have an appt to see her neurologist next week. She is a good candidate for skill PT intervention both aquatic and land to progress her toward improvement in functional mobility and Adl's as well as decreasing fall risk.   OBJECTIVE IMPAIRMENTS: Abnormal gait, decreased activity tolerance, decreased balance, decreased knowledge of use of DME, decreased mobility, decreased ROM, decreased strength, and pain.   ACTIVITY LIMITATIONS: lifting, bending, squatting, sleeping, stairs, transfers, and locomotion level  PARTICIPATION LIMITATIONS: meal prep, cleaning, shopping, community activity, and yard work  PERSONAL FACTORS: Time since onset of injury/illness/exacerbation and 1-2 comorbidities: see PmHx are also affecting patient's functional outcome.   REHAB POTENTIAL: Good  CLINICAL DECISION MAKING: Evolving/moderate complexity  EVALUATION COMPLEXITY: Moderate   GOALS: Goals reviewed with patient? Yes  SHORT TERM GOALS: Target date: 08/21/23  Pt will tolerate full aquatic sessions consistently without increase in pain and with improving function to demonstrate good toleration and effectiveness of intervention.  Baseline: Goal status: IN PROGRESS -08/20/23  2.  Pt will return to regular exercise weekly Baseline:  Goal status: INITIAL  3.  Pt will tolerate stair climbing using  combination of alternating and step to pattern ascending and descending 6 steps without use of handrail  Baseline:  Goal status: INITIAL  4.  Pt will report good toleration to  gardening without excessive pain Baseline:  Goal status: INITIAL    LONG TERM GOALS: Target date: 09/26/23  Pt to improve on ODI by 13-15% to demonstrate statistically significant Improvement in function. Baseline: 22/50=44% Goal status: INITIAL  2.  Pt will be indep with final HEP's (land and aquatic as appropriate) for continued management of condition Baseline:  Goal status: INITIAL  3.  Pt will improve strength in hips by at least 5 lbs to demonstrate improved overall physical function Baseline:  Goal status: INITIAL  4.  Pt will improve on Berg balance test to >/= 45/56 to demonstrate a decrease in fall risk. Baseline:32/56  Goal status: INITIAL  5.  Pt will report decrease in pain by at least 50% for improved toleration to activity/quality of life and to demonstrate improved management of pain. Baseline: see chart Goal status: INITIAL  PLAN:  PT FREQUENCY: 2x/week  PT DURATION: 8 weeks  first 6 pool only then remainder alternating land and water for optimal outcome  PLANNED INTERVENTIONS: 97164- PT Re-evaluation, 97110-Therapeutic exercises, 97530- Therapeutic activity, 97112- Neuromuscular re-education, 97535- Self Care, 16109- Manual therapy, [id number]- Gait training, [phone]- Orthotic Initial, [phone]- Aquatic Therapy, [phone]- Ionotophoresis 4mg /ml Dexamethasone , Patient/Family education, Balance training, Stair training, Taping, Dry Needling, Joint mobilization, DME instructions, Cryotherapy, and Moist heat.  PLAN FOR NEXT SESSION: Land/aquatic: le and core strengthening, gait training, balance retraining, transitional movements/STS transfers; activity toleration. Use of AD as approp   Adriana Hopping Laneta Pintos) Adilynn Bessey MPT 08/22/23 10:23 AM [facility] [address] [place], [place], [address] Phone: [phone]   Fax:  [phone]    Date of referral: 06/19/23 Referring provider: [facility] Referring diagnosis? LBP Treatment  diagnosis? (if different than referring diagnosis) LBP and Knee pain  What was this (referring dx) caused by? Ongoing Issue and Arthritis  Lonne Roan of Condition: Chronic (continuous duration > 3 months)   Laterality: Both  Current Functional Measure Score: Other ODI 44%  Objective measurements identify impairments when they are compared to normal values, the uninvolved extremity, and prior level of function.  [x]  Yes  []  No  Objective assessment of functional ability: Moderate functional limitations   Briefly describe symptoms: Pain lb radicularr pattern into lle; right knee pain  How did symptoms start: chronic  Average pain intensity:  Last 24 hours: 7/10  Past week: 7/10  How often does the pt experience symptoms? Constantly  How much have the symptoms interfered with usual daily activities? Quite a bit  How has condition changed since care began at this facility? NA - initial visit  In general, how is the patients overall health? Good   BACK PAIN (STarT Back Screening Tool) Has pain spread down the leg(s) at some time in the last 2 weeks? yes Has the pt only walked short distances because of back pain? yes Does patient think it's not safe for a person with this condition to be physically active? yes Does patient feel back pain  is terrible and will never get any better? yes Has patient stopped enjoying things they usually enjoy? yes

## 2023-08-27 ENCOUNTER — Encounter (HOSPITAL_BASED_OUTPATIENT_CLINIC_OR_DEPARTMENT_OTHER): Payer: Self-pay | Admitting: Physical Therapy

## 2023-08-27 ENCOUNTER — Ambulatory Visit (HOSPITAL_BASED_OUTPATIENT_CLINIC_OR_DEPARTMENT_OTHER): Admitting: Physical Therapy

## 2023-08-27 DIAGNOSIS — M6281 Muscle weakness (generalized): Secondary | ICD-10-CM

## 2023-08-27 DIAGNOSIS — G8929 Other chronic pain: Secondary | ICD-10-CM

## 2023-08-27 DIAGNOSIS — R2681 Unsteadiness on feet: Secondary | ICD-10-CM

## 2023-08-27 DIAGNOSIS — M5459 Other low back pain: Secondary | ICD-10-CM | POA: Diagnosis not present

## 2023-08-27 NOTE — Therapy (Signed)
 OUTPATIENT PHYSICAL THERAPY THORACOLUMBAR TREATMENT   Patient Name: Autumn Paul MRN: [id number] DOB:[birth year], 78 y.o., female Today's Date: 08/27/2023  END OF SESSION:  PT End of Session - 08/27/23 1004     Visit Number 4    Number of Visits 16    Date for PT Re-Evaluation 09/26/23    Authorization Type [employer]    Authorization Time Period 07/30/23-09/24/23    Authorization - Number of Visits 6    PT Start Time 0932    PT Stop Time 1011    PT Time Calculation (min) 39 min    Activity Tolerance Patient tolerated treatment well    Behavior During Therapy WFL for tasks assessed/performed              Past Medical History:  Diagnosis Date   Fibromyalgia    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    uses Timolol   Hyperlipidemia    Osteoporosis    Spinal stenosis    Past Surgical History:  Procedure Laterality Date   CARPAL TUNNEL RELEASE     right wrist   CATARACT EXTRACTION     COLONOSCOPY     RHINOPLASTY     TUBAL LIGATION     Patient Active Problem List   Diagnosis Date Noted   After cataract of left eye not obscuring vision 07/26/2014   Glaucoma suspect, both eyes 08/02/2013   Conjunctivochalasis 04/07/2012   Dermatochalasis 04/07/2012   Dry eyes 04/07/2012   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   Meibomian gland dysfunction 04/07/2012   Myopia with astigmatism and presbyopia 04/07/2012   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   Posterior vitreous detachment 04/07/2012   Vitreous syneresis 04/07/2012    PCP: Pecolia Bourbon MD  REFERRING PROVIDER:   Virl Grimes, MD    REFERRING DIAG: M54.50 (ICD-10-CM) - Low back pain, unspecified   Rationale for Evaluation and Treatment: Rehabilitation  THERAPY DIAG:  Other low back pain  Muscle weakness (generalized)  Chronic pain of right knee  Unsteadiness on feet  ONSET DATE: exacerbating for last 6 months  SUBJECTIVE:                                                                                                                                                                                            SUBJECTIVE STATEMENT: Autumn Paul reports spinal injection has reduced pain.  Had MRI yesterday  POOL ACCESS: considering joining [employer].   From evaluation:  I have had back problems for years.  Have bilateral knee pain need an OA.  Saw Dr Jackee Marus for my back and he said I was not a surgical candidate.  I also saw Dr Vaughn Georges but didn't really think he did much.  I do have an appt with a nuero guy Dr Rochelle Chu next week.  Not sure which one is making the other worse knee vs back.  Had knee injections  which help some but the back injection didn't help.  Stopped with exercising 4 x a week back 6 months ago due to back pain  PERTINENT HISTORY:  history of hyperlipidemia glaucoma, fibromyalgia, spinal stenosis. With recent spinal injection.  Went to ER 5/4 for LBP with sciatica  PAIN:  Are you having pain? Yes: NPRS scale: 3.5/10 Pain location: lower back into buttocks and bilat knees Pain description: achy Aggravating factors: standing/weight bearing; working in garden 30 min Relieving factors: sitting, oxycodone     PRECAUTIONS: None  RED FLAGS: None   WEIGHT BEARING RESTRICTIONS: No  FALLS:  Has patient fallen in last 6 months? No  LIVING ENVIRONMENT: Lives with: lives with their spouse Lives in: House/apartment  OCCUPATION: retired  PLOF: Independent  PATIENT GOALS: decrease pain, walk longer  NEXT MD VISIT: Dr Rochelle Chu Next week  OBJECTIVE:  Note: Objective measures were completed at Evaluation unless otherwise noted.  DIAGNOSTIC FINDINGS:  X-ray hip MPRESSION: 1. No acute findings. 2. Lumbar spondylosis.  PATIENT SURVEYS:  22/50=44%  COGNITION: Overall cognitive status: Within functional limits for tasks assessed      MUSCLE LENGTH: Hamstrings: tested in sitting tight R>L   POSTURE: rounded shoulders, forward head, decreased lumbar lordosis, and  weight shift left  PALPATION: TTP throughout lower thoracic and lumbar pain left glut. Crepitus bilat patella  LUMBAR ROM:   AROM eval  Flexion full  Extension Neutral P!  Right lateral flexion Full P!  Left lateral flexion Full P!  Right rotation   Left rotation    (Blank rows = not tested)  LOWER EXTREMITY ROM:     wfl  LOWER EXTREMITY MMT:    HD (lbs) Tested in sitting Right eval Left eval  Hip flexion 32.7 33.2  Hip extension    Hip abduction 23.0 21.2  Hip adduction    Hip internal rotation    Hip external rotation    Knee flexion    Knee extension 34.2 22.5  Ankle dorsiflexion    Ankle plantarflexion    Ankle inversion    Ankle eversion     (Blank rows = not tested)  LUMBAR SPECIAL TESTS:  Slump test: Negative  FUNCTIONAL TESTS:  5 times sit to stand: 20.96 Timed up and go (TUG): 19.04  BERG Balance Test          Date: eval  Sit to Stand 3  Standing unsupported 4  Sitting with back unsupported but feet supported 4  Stand to sit  2  Transfers  3  Standing unsupported with eyes closed 2  Standing unsupported feet together 3  From standing position, reach forward with outstretched arm 3  From standing position, pick up object from floor 3  From standing position, turn and look behind over each shoulder 2  Turn 360 2  Standing unsupported, alternately place foot on step 1  Standing unsupported, one foot in front 0  Standing on one leg 0  Total:  32/56     GAIT: Distance walked: 400 ft Assistive device utilized: None Level of assistance: Complete Independence Comments: Increased knee flex/step height (march like), trunk guarding and antalgic limp through LLE  TREATMENT  [facility] Adult PT Treatment:                                             08/22/23 Pt seen for aquatic therapy today.  Treatment took place in water 3.5-4.75 ft in  depth at the [facility] pool. Temp of water was 91.  Pt entered/exited the pool via stairs independently with bilat rail.  - unsupported walking forward/ backward - cues for even step length - suitcase carry marching backward/forward with bilat yellow->RB yellow and single yellow hand floats at side.  Balance (static and dynamic) UE support RBHB 3.6 ft: FT x 20s hold->ue add/abd x 10 good execution  -tandem leading R/L x20s hold->ue add/abd .  Cues for focus visually on wall. Good execution  -SLS R/L 20 sec hold after a few trials.  Ue add/abd difficult lle, less difficult right - forward walking kicks difficult maintaining balance - UE support RBHB: df; PF. Cues for core engagement and balance -L stretch->tail wag -figure 4 stretch ue support on hand rails *walking and squatted position between exercises as needed for recovery. -stair negotiation using with instruction on step to as well as alternating pattern without difficulty.  Minor right hip soreness   Pt requires the buoyancy and hydrostatic pressure of water for support, and to offload joints by unweighting joint load by at least 50 % in navel deep water and by at least 75-80% in chest to neck deep water.  Viscosity of the water is needed for resistance of strengthening. Water current perturbations provides challenge to standing balance requiring increased core activation.      PATIENT EDUCATION:  Education details: reacquainting with aquatic therapy  Person educated: Patient Education method: Chief Technology Officer Education comprehension: verbalized understanding  HOME EXERCISE PROGRAM: tba  ASSESSMENT:  CLINICAL IMPRESSION: Focus today on balance and core strength.  Pt challenged with all activities maintaining direct paths and/or static position.  Balance activities completed in 3.6-8 ft.  Autumn Paul gives good effort with good attitude. All unsteadiness recovered from indep.  Cues throughout for core  engagement.   From initial evaluation:  Patient is a 78 y.o. f who was seen today for physical therapy evaluation and treatment for LBP.  Pt presents with gait deviation due to pain in bilateral knees (R>L) and LBP left lumbar area with radicular pattern into left leg to knee.  Autumn Paul demonstrates a strength deficit and is a high fall risk as per Randye Buttner.  Autumn Paul has been told Autumn Paul is not a surgical candidate for back but is for a TKR which Autumn Paul has been delaying in attempts to avoid. Autumn Paul has seen many Mds of the same specialty trying to get a consensus of forward treatment on LB but has not had any success other than being referred over to aquatic PT. Autumn Paul does have an appt to see her neurologist next week. Autumn Paul is a good candidate for skill PT intervention both aquatic and land to progress her toward improvement in functional mobility and Adl's as well as decreasing fall risk.   OBJECTIVE IMPAIRMENTS: Abnormal gait, decreased activity tolerance, decreased balance, decreased knowledge of use of DME, decreased mobility, decreased ROM, decreased strength, and pain.   ACTIVITY LIMITATIONS: lifting, bending, squatting, sleeping, stairs, transfers, and locomotion level  PARTICIPATION LIMITATIONS: meal prep, cleaning, shopping, community activity, and yard work  PERSONAL FACTORS: Time since onset of injury/illness/exacerbation and  1-2 comorbidities: see PmHx are also affecting patient's functional outcome.   REHAB POTENTIAL: Good  CLINICAL DECISION MAKING: Evolving/moderate complexity  EVALUATION COMPLEXITY: Moderate   GOALS: Goals reviewed with patient? Yes  SHORT TERM GOALS: Target date: 08/21/23  Pt will tolerate full aquatic sessions consistently without increase in pain and with improving function to demonstrate good toleration and effectiveness of intervention.  Baseline: Goal status: IN PROGRESS -08/20/23  2.  Pt will return to regular exercise weekly Baseline:  Goal status: INITIAL  3.  Pt will  tolerate stair climbing using  combination of alternating and step to pattern ascending and descending 6 steps without use of handrail  Baseline:  Goal status: INITIAL  4.  Pt will report good toleration to gardening without excessive pain Baseline:  Goal status: INITIAL    LONG TERM GOALS: Target date: 09/26/23  Pt to improve on ODI by 13-15% to demonstrate statistically significant Improvement in function. Baseline: 22/50=44% Goal status: INITIAL  2.  Pt will be indep with final HEP's (land and aquatic as appropriate) for continued management of condition Baseline:  Goal status: INITIAL  3.  Pt will improve strength in hips by at least 5 lbs to demonstrate improved overall physical function Baseline:  Goal status: INITIAL  4.  Pt will improve on Berg balance test to >/= 45/56 to demonstrate a decrease in fall risk. Baseline:32/56  Goal status: INITIAL  5.  Pt will report decrease in pain by at least 50% for improved toleration to activity/quality of life and to demonstrate improved management of pain. Baseline: see chart Goal status: INITIAL  PLAN:  PT FREQUENCY: 2x/week  PT DURATION: 8 weeks  first 6 pool only then remainder alternating land and water for optimal outcome  PLANNED INTERVENTIONS: 97164- PT Re-evaluation, 97110-Therapeutic exercises, 97530- Therapeutic activity, 97112- Neuromuscular re-education, 97535- Self Care, 16109- Manual therapy, [id number]- Gait training, [phone]- Orthotic Initial, [phone]- Aquatic Therapy, [phone]- Ionotophoresis 4mg /ml Dexamethasone , Patient/Family education, Balance training, Stair training, Taping, Dry Needling, Joint mobilization, DME instructions, Cryotherapy, and Moist heat.  PLAN FOR NEXT SESSION: Land/aquatic: le and core strengthening, gait training, balance retraining, transitional movements/STS transfers; activity toleration. Use of AD as approp   Adriana Hopping Laneta Pintos) Goldy Calandra MPT 08/27/23 10:06 AM [facility] [address] [place], [place], [address] Phone: [phone]   Fax:  [phone]    Date of referral: 06/19/23 Referring provider: [facility] Referring diagnosis? LBP Treatment diagnosis? (if different than referring diagnosis) LBP and Knee pain  What was this (referring dx) caused by? Ongoing Issue and Arthritis  Lonne Roan of Condition: Chronic (continuous duration > 3 months)   Laterality: Both  Current Functional Measure Score: Other ODI 44%  Objective measurements identify impairments when they are compared to normal values, the uninvolved extremity, and prior level of function.  [x]  Yes  []  No  Objective assessment of functional ability: Moderate functional limitations   Briefly describe symptoms: Pain lb radicularr pattern into lle; right knee pain  How did symptoms start: chronic  Average pain intensity:  Last 24 hours: 7/10  Past week: 7/10  How often does the pt experience symptoms? Constantly  How much have the symptoms interfered with usual daily activities? Quite a bit  How has condition changed since care began at this facility? NA - initial visit  In general, how is the patients overall health? Good   BACK PAIN (STarT Back Screening Tool) Has pain spread down the leg(s) at some time in the last 2 weeks? yes  Has the pt only walked short distances because of back pain? yes Does patient think it's not safe for a person with this condition to be physically active? yes Does patient feel back pain is terrible and will never get any better? yes Has patient stopped enjoying things they usually enjoy? yes

## 2023-08-29 ENCOUNTER — Ambulatory Visit (HOSPITAL_BASED_OUTPATIENT_CLINIC_OR_DEPARTMENT_OTHER): Admitting: Physical Therapy

## 2023-08-29 ENCOUNTER — Encounter (HOSPITAL_BASED_OUTPATIENT_CLINIC_OR_DEPARTMENT_OTHER): Payer: Self-pay | Admitting: Physical Therapy

## 2023-08-29 DIAGNOSIS — M5459 Other low back pain: Secondary | ICD-10-CM

## 2023-08-29 DIAGNOSIS — R2681 Unsteadiness on feet: Secondary | ICD-10-CM

## 2023-08-29 DIAGNOSIS — M6281 Muscle weakness (generalized): Secondary | ICD-10-CM

## 2023-08-29 DIAGNOSIS — G8929 Other chronic pain: Secondary | ICD-10-CM

## 2023-08-29 NOTE — Therapy (Signed)
 OUTPATIENT PHYSICAL THERAPY THORACOLUMBAR TREATMENT   Patient Name: Autumn Paul MRN: [id number] DOB:Lemmerman 15, 1947, 78 y.o., female Today's Date: 08/29/2023  END OF SESSION:  PT End of Session - 08/29/23 0932     Visit Number 5    Number of Visits 16    Date for PT Re-Evaluation 09/26/23    Authorization Type [employer]    Authorization Time Period 07/30/23-09/24/23    Authorization - Visit Number 4    Authorization - Number of Visits 6    Progress Note Due on Visit 10    PT Start Time 0933    PT Stop Time 1012    PT Time Calculation (min) 39 min    Behavior During Therapy WFL for tasks assessed/performed           Past Medical History:  Diagnosis Date   Fibromyalgia    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    uses Timolol   Hyperlipidemia    Osteoporosis    Spinal stenosis    Past Surgical History:  Procedure Laterality Date   CARPAL TUNNEL RELEASE     right wrist   CATARACT EXTRACTION     COLONOSCOPY     RHINOPLASTY     TUBAL LIGATION     Patient Active Problem List   Diagnosis Date Noted   After cataract of left eye not obscuring vision 07/26/2014   Glaucoma suspect, both eyes 08/02/2013   Conjunctivochalasis 04/07/2012   Dermatochalasis 04/07/2012   Dry eyes 04/07/2012   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   Meibomian gland dysfunction 04/07/2012   Myopia with astigmatism and presbyopia 04/07/2012   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   Posterior vitreous detachment 04/07/2012   Vitreous syneresis 04/07/2012    PCP: Pecolia Bourbon MD  REFERRING PROVIDER:   Virl Grimes, MD    REFERRING DIAG: M54.50 (ICD-10-CM) - Low back pain, unspecified   Rationale for Evaluation and Treatment: Rehabilitation  THERAPY DIAG:  Other low back pain  Muscle weakness (generalized)  Chronic pain of right knee  Unsteadiness on feet  ONSET DATE: exacerbating for last 6 months  SUBJECTIVE:                                                                                                                                                                                            SUBJECTIVE STATEMENT: Pt reports injection helped her L lower hip has helped, no pain.  She gets results from MRI at end of month.  She worked in garden yesterday, so her R knee is sore today.   POOL ACCESS: considering joining [employer].   From evaluation:  I  have had back problems for years.  Have bilateral knee pain need an OA.  Saw Dr Jackee Marus for my back and he said I was not a surgical candidate.  I also saw Dr Vaughn Georges but didn't really think he did much.  I do have an appt with a nuero guy Dr Rochelle Chu next week.  Not sure which one is making the other worse knee vs back.  Had knee injections  which help some but the back injection didn't help.  Stopped with exercising 4 x a week back 6 months ago due to back pain  PERTINENT HISTORY:  history of hyperlipidemia glaucoma, fibromyalgia, spinal stenosis. With recent spinal injection.  Went to ER 5/4 for LBP with sciatica  PAIN:  Are you having pain? Yes: NPRS scale: 4/10 Pain location: R knee Pain description: achy Aggravating factors: standing/weight bearing; working in garden 30 min Relieving factors: sitting, oxycodone     PRECAUTIONS: None  RED FLAGS: None   WEIGHT BEARING RESTRICTIONS: No  FALLS:  Has patient fallen in last 6 months? No  LIVING ENVIRONMENT: Lives with: lives with their spouse Lives in: House/apartment  OCCUPATION: retired  PLOF: Independent  PATIENT GOALS: decrease pain, walk longer  NEXT MD VISIT: Dr Rochelle Chu Next week  OBJECTIVE:  Note: Objective measures were completed at Evaluation unless otherwise noted.  DIAGNOSTIC FINDINGS:  X-ray hip MPRESSION: 1. No acute findings. 2. Lumbar spondylosis.  PATIENT SURVEYS:  22/50=44%  COGNITION: Overall cognitive status: Within functional limits for tasks assessed      MUSCLE LENGTH: Hamstrings: tested in sitting tight  R>L   POSTURE: rounded shoulders, forward head, decreased lumbar lordosis, and weight shift left  PALPATION: TTP throughout lower thoracic and lumbar pain left glut. Crepitus bilat patella  LUMBAR ROM:   AROM eval  Flexion full  Extension Neutral P!  Right lateral flexion Full P!  Left lateral flexion Full P!  Right rotation   Left rotation    (Blank rows = not tested)  LOWER EXTREMITY ROM:     wfl  LOWER EXTREMITY MMT:    HD (lbs) Tested in sitting Right eval Left eval  Hip flexion 32.7 33.2  Hip extension    Hip abduction 23.0 21.2  Hip adduction    Hip internal rotation    Hip external rotation    Knee flexion    Knee extension 34.2 22.5  Ankle dorsiflexion    Ankle plantarflexion    Ankle inversion    Ankle eversion     (Blank rows = not tested)  LUMBAR SPECIAL TESTS:  Slump test: Negative  FUNCTIONAL TESTS:  5 times sit to stand: 20.96 Timed up and go (TUG): 19.04  BERG Balance Test          Date: eval  Sit to Stand 3  Standing unsupported 4  Sitting with back unsupported but feet supported 4  Stand to sit  2  Transfers  3  Standing unsupported with eyes closed 2  Standing unsupported feet together 3  From standing position, reach forward with outstretched arm 3  From standing position, pick up object from floor 3  From standing position, turn and look behind over each shoulder 2  Turn 360 2  Standing unsupported, alternately place foot on step 1  Standing unsupported, one foot in front 0  Standing on one leg 0  Total:  32/56     GAIT: Distance walked: 400 ft Assistive device utilized: None Level of assistance: Complete Independence Comments: Increased knee flex/step height (  march like), trunk guarding and antalgic limp through LLE  TREATMENT                                                                                                                         [facility] Adult PT Treatment:                                              09/01/23 Pt seen for aquatic therapy today.  Treatment took place in water 3.5-4.75 ft in depth at the [facility] pool. Temp of water was 91.  Pt entered/exited the pool via stairs independently with bilat rail.  - unsupported walking forward/ backward - cues for even step length - suitcase carry marching backward/forward with single yellow hand floats at side-> bilat rainbow hand floats - tandem gait forward/ backward with UE on yellow hand floats, cues to slow spped - UE on yellow hand floats:  LE swings into hip abdct/ addct 2 x 10 (cues to control height of leg), hip flex/ext x 10 each LE - in 17ft6: SLS x 15sec x 2 reps LLE, 1 RLE (very difficult on LLE, hops to steady) - walking forward for rest and recovery  - arms crossed at chest: 3 way toe tap x 10 each LE - grapevine R/L x 2 - squats pushing single rainbow -> yellow hand float under water x 15 - seated on bench in water: cycling, hip abdct/ addct; alternating LAQ; cycling (some R hip pain) - standing hamstring stretch with heel on 3rd step x 15s  -figure 4 stretch ue support on hand rails    Pt requires the buoyancy and hydrostatic pressure of water for support, and to offload joints by unweighting joint load by at least 50 % in navel deep water and by at least 75-80% in chest to neck deep water.  Viscosity of the water is needed for resistance of strengthening. Water current perturbations provides challenge to standing balance requiring increased core activation.      PATIENT EDUCATION:  Education details: reacquainting with aquatic therapy  Person educated: Patient Education method: Chief Technology Officer Education comprehension: verbalized understanding  HOME EXERCISE PROGRAM: tba  ASSESSMENT:  CLINICAL IMPRESSION: Focus today on balance and core strength.  Pt challenged primarily with L SLS exercises. R hamstring /hip rotators tighter than L during stretches.  She requires occasional cues throughout for  core engagement.All unsteadiness recovered from indep. Pt reporting improved mobility at home, allowing her to be more active in her garden.  Therapist to check STG3 as time allows next visit.   From initial evaluation:  Patient is a 78 y.o. f who was seen today for physical therapy evaluation and treatment for LBP.  Pt presents with gait deviation due to pain in bilateral knees (R>L) and LBP left lumbar area with radicular pattern into left leg to knee.  She demonstrates a strength deficit and is a high fall risk as per Randye Buttner.  She has been told she is not a surgical candidate for back but is for a TKR which she has been delaying in attempts to avoid. She has seen many Mds of the same specialty trying to get a consensus of forward treatment on LB but has not had any success other than being referred over to aquatic PT. She does have an appt to see her neurologist next week. She is a good candidate for skill PT intervention both aquatic and land to progress her toward improvement in functional mobility and Adl's as well as decreasing fall risk.   OBJECTIVE IMPAIRMENTS: Abnormal gait, decreased activity tolerance, decreased balance, decreased knowledge of use of DME, decreased mobility, decreased ROM, decreased strength, and pain.   ACTIVITY LIMITATIONS: lifting, bending, squatting, sleeping, stairs, transfers, and locomotion level  PARTICIPATION LIMITATIONS: meal prep, cleaning, shopping, community activity, and yard work  PERSONAL FACTORS: Time since onset of injury/illness/exacerbation and 1-2 comorbidities: see PmHx are also affecting patient's functional outcome.   REHAB POTENTIAL: Good  CLINICAL DECISION MAKING: Evolving/moderate complexity  EVALUATION COMPLEXITY: Moderate   GOALS: Goals reviewed with patient? Yes  SHORT TERM GOALS: Target date: 08/21/23  Pt will tolerate full aquatic sessions consistently without increase in pain and with improving function to demonstrate good toleration  and effectiveness of intervention.  Baseline: Goal status: MET - 08/29/23  2.  Pt will return to regular exercise weekly Baseline:  Goal status: INITIAL  3.  Pt will tolerate stair climbing using  combination of alternating and step to pattern ascending and descending 6 steps without use of handrail  Baseline:  Goal status: INITIAL  4.  Pt will report good toleration to gardening without excessive pain Baseline:  Goal status: INITIAL    LONG TERM GOALS: Target date: 09/26/23  Pt to improve on ODI by 13-15% to demonstrate statistically significant Improvement in function. Baseline: 22/50=44% Goal status: INITIAL  2.  Pt will be indep with final HEP's (land and aquatic as appropriate) for continued management of condition Baseline:  Goal status: INITIAL  3.  Pt will improve strength in hips by at least 5 lbs to demonstrate improved overall physical function Baseline:  Goal status: INITIAL  4.  Pt will improve on Berg balance test to >/= 45/56 to demonstrate a decrease in fall risk. Baseline:32/56  Goal status: INITIAL  5.  Pt will report decrease in pain by at least 50% for improved toleration to activity/quality of life and to demonstrate improved management of pain. Baseline: see chart Goal status: INITIAL  PLAN:  PT FREQUENCY: 2x/week  PT DURATION: 8 weeks  first 6 pool only then remainder alternating land and water for optimal outcome  PLANNED INTERVENTIONS: [id number]- PT Re-evaluation, 97110-Therapeutic exercises, 97530- Therapeutic activity, 97112- Neuromuscular re-education, 97535- Self Care, [id number]- Manual therapy, [id number]- Gait training, [phone]- Orthotic Initial, [phone]- Aquatic Therapy, [phone]- Ionotophoresis 4mg /ml Dexamethasone , Patient/Family education, Balance training, Stair training, Taping, Dry Needling, Joint mobilization, DME instructions, Cryotherapy, and Moist heat.  PLAN FOR NEXT SESSION: Land/aquatic: le and core strengthening, gait training, balance  retraining, transitional movements/STS transfers; activity toleration. Use of AD as approp   Almedia Jacobsen, PTA 08/29/23 10:09 AM [facility] [address] [place], [place], [address] Phone: [phone]   Fax:  [phone]   Date of referral: 06/19/23 Referring provider: [facility] Referring diagnosis? LBP Treatment diagnosis? (if different than referring diagnosis) LBP and Knee pain  What  was this (referring dx) caused by? Ongoing Issue and Arthritis  Lonne Roan of Condition: Chronic (continuous duration > 3 months)   Laterality: Both  Current Functional Measure Score: Other ODI 44%  Objective measurements identify impairments when they are compared to normal values, the uninvolved extremity, and prior level of function.  [x]  Yes  []  No  Objective assessment of functional ability: Moderate functional limitations   Briefly describe symptoms: Pain lb radicularr pattern into lle; right knee pain  How did symptoms start: chronic  Average pain intensity:  Last 24 hours: 7/10  Past week: 7/10  How often does the pt experience symptoms? Constantly  How much have the symptoms interfered with usual daily activities? Quite a bit  How has condition changed since care began at this facility? NA - initial visit  In general, how is the patients overall health? Good   BACK PAIN (STarT Back Screening Tool) Has pain spread down the leg(s) at some time in the last 2 weeks? yes Has the pt only walked short distances because of back pain? yes Does patient think it's not safe for a person with this condition to be physically active? yes Does patient feel back pain is terrible and will never get any better? yes Has patient stopped enjoying things they usually enjoy? yes

## 2023-09-02 ENCOUNTER — Encounter (HOSPITAL_BASED_OUTPATIENT_CLINIC_OR_DEPARTMENT_OTHER): Payer: Self-pay | Admitting: Physical Therapy

## 2023-09-02 ENCOUNTER — Ambulatory Visit (HOSPITAL_BASED_OUTPATIENT_CLINIC_OR_DEPARTMENT_OTHER): Admitting: Physical Therapy

## 2023-09-02 DIAGNOSIS — M5459 Other low back pain: Secondary | ICD-10-CM

## 2023-09-02 DIAGNOSIS — M6281 Muscle weakness (generalized): Secondary | ICD-10-CM

## 2023-09-02 DIAGNOSIS — R2681 Unsteadiness on feet: Secondary | ICD-10-CM

## 2023-09-02 DIAGNOSIS — G8929 Other chronic pain: Secondary | ICD-10-CM

## 2023-09-02 NOTE — Therapy (Signed)
 OUTPATIENT PHYSICAL THERAPY THORACOLUMBAR TREATMENT   Patient Name: Quinnlyn Hearns Perri MRN: [id number] DOB:1945-07-21, 78 y.o., female Today's Date: 09/02/2023  END OF SESSION:  PT End of Session - 09/02/23 0933     Visit Number 6    Number of Visits 16    Date for PT Re-Evaluation 09/26/23    Authorization Type [employer]    Authorization Time Period 07/30/23-09/24/23    Authorization - Visit Number 5    Authorization - Number of Visits 6    Progress Note Due on Visit 10    PT Start Time 0933    PT Stop Time 1011    PT Time Calculation (min) 38 min    Activity Tolerance Patient tolerated treatment well    Behavior During Therapy WFL for tasks assessed/performed           Past Medical History:  Diagnosis Date   Fibromyalgia    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    uses Timolol   Hyperlipidemia    Osteoporosis    Spinal stenosis    Past Surgical History:  Procedure Laterality Date   CARPAL TUNNEL RELEASE     right wrist   CATARACT EXTRACTION     COLONOSCOPY     RHINOPLASTY     TUBAL LIGATION     Patient Active Problem List   Diagnosis Date Noted   After cataract of left eye not obscuring vision 07/26/2014   Glaucoma suspect, both eyes 08/02/2013   Conjunctivochalasis 04/07/2012   Dermatochalasis 04/07/2012   Dry eyes 04/07/2012   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   Meibomian gland dysfunction 04/07/2012   Myopia with astigmatism and presbyopia 04/07/2012   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   Posterior vitreous detachment 04/07/2012   Vitreous syneresis 04/07/2012    PCP: Pecolia Bourbon MD  REFERRING PROVIDER:   Virl Grimes, MD    REFERRING DIAG: M54.50 (ICD-10-CM) - Low back pain, unspecified   Rationale for Evaluation and Treatment: Rehabilitation  THERAPY DIAG:  Other low back pain  Muscle weakness (generalized)  Chronic pain of right knee  Unsteadiness on feet  ONSET DATE: exacerbating for last 6 months  SUBJECTIVE:                                                                                                                                                                                            SUBJECTIVE STATEMENT: Pt reports PT has been great. Having issues with R knee which has been going on for years. Injection on L low back helping with with sciatica.   POOL ACCESS: considering joining [facility].   From evaluation:  I have had back problems for years.  Have bilateral knee pain need an OA.  Saw Dr Jackee Marus for my back and he said I was not a surgical candidate.  I also saw Dr Vaughn Georges but didn't really think he did much.  I do have an appt with a nuero guy Dr Rochelle Chu next week.  Not sure which one is making the other worse knee vs back.  Had knee injections  which help some but the back injection didn't help.  Stopped with exercising 4 x a week back 6 months ago due to back pain  PERTINENT HISTORY:  history of hyperlipidemia glaucoma, fibromyalgia, spinal stenosis. With recent spinal injection.  Went to ER 5/4 for LBP with sciatica  PAIN:  Are you having pain? Yes: NPRS scale: 6/10 Pain location: R knee Pain description: achy Aggravating factors: standing/weight bearing; working in garden 30 min Relieving factors: sitting, oxycodone     PRECAUTIONS: None  RED FLAGS: None   WEIGHT BEARING RESTRICTIONS: No  FALLS:  Has patient fallen in last 6 months? No  LIVING ENVIRONMENT: Lives with: lives with their spouse Lives in: House/apartment  OCCUPATION: retired  PLOF: Independent  PATIENT GOALS: decrease pain, walk longer  NEXT MD VISIT: Dr Rochelle Chu Next week  OBJECTIVE:  Note: Objective measures were completed at Evaluation unless otherwise noted.  DIAGNOSTIC FINDINGS:  X-ray hip MPRESSION: 1. No acute findings. 2. Lumbar spondylosis.  PATIENT SURVEYS:  22/50=44%  COGNITION: Overall cognitive status: Within functional limits for tasks assessed      MUSCLE LENGTH: Hamstrings:  tested in sitting tight R>L   POSTURE: rounded shoulders, forward head, decreased lumbar lordosis, and weight shift left  PALPATION: TTP throughout lower thoracic and lumbar pain left glut. Crepitus bilat patella  LUMBAR ROM:   AROM eval  Flexion full  Extension Neutral P!  Right lateral flexion Full P!  Left lateral flexion Full P!  Right rotation   Left rotation    (Blank rows = not tested)  LOWER EXTREMITY ROM:     wfl  LOWER EXTREMITY MMT:    HD (lbs) Tested in sitting Right eval Left eval  Hip flexion 32.7 33.2  Hip extension    Hip abduction 23.0 21.2  Hip adduction    Hip internal rotation    Hip external rotation    Knee flexion    Knee extension 34.2 22.5  Ankle dorsiflexion    Ankle plantarflexion    Ankle inversion    Ankle eversion     (Blank rows = not tested)  LUMBAR SPECIAL TESTS:  Slump test: Negative  FUNCTIONAL TESTS:  5 times sit to stand: 20.96 Timed up and go (TUG): 19.04  BERG Balance Test          Date: eval  Sit to Stand 3  Standing unsupported 4  Sitting with back unsupported but feet supported 4  Stand to sit  2  Transfers  3  Standing unsupported with eyes closed 2  Standing unsupported feet together 3  From standing position, reach forward with outstretched arm 3  From standing position, pick up object from floor 3  From standing position, turn and look behind over each shoulder 2  Turn 360 2  Standing unsupported, alternately place foot on step 1  Standing unsupported, one foot in front 0  Standing on one leg 0  Total:  32/56     GAIT: Distance walked: 400 ft Assistive device utilized: None Level of assistance: Complete Independence Comments: Increased knee flex/step  height (march like), trunk guarding and antalgic limp through LLE  TREATMENT                                                                                                                         09/02/23 Standing hip abduction 2 x 10 Standing  hip extension 2 x 10 Step up 4 inch 2 x 10 Standing row GTB 2 x 10 Standing shoulder extension GTB 2 x 10 LAQ 1 x 10 with 5 second holds Mini squat 1 x 10 - difficulty with mechanics STS with 2 airex 2 x 10    [facility] Adult PT Treatment:                                             09/01/23 Pt seen for aquatic therapy today.  Treatment took place in water 3.5-4.75 ft in depth at the [facility] pool. Temp of water was 91.  Pt entered/exited the pool via stairs independently with bilat rail.  - unsupported walking forward/ backward - cues for even step length - suitcase carry marching backward/forward with single yellow hand floats at side-> bilat rainbow hand floats - tandem gait forward/ backward with UE on yellow hand floats, cues to slow spped - UE on yellow hand floats:  LE swings into hip abdct/ addct 2 x 10 (cues to control height of leg), hip flex/ext x 10 each LE - in 52ft6: SLS x 15sec x 2 reps LLE, 1 RLE (very difficult on LLE, hops to steady) - walking forward for rest and recovery  - arms crossed at chest: 3 way toe tap x 10 each LE - grapevine R/L x 2 - squats pushing single rainbow -> yellow hand float under water x 15 - seated on bench in water: cycling, hip abdct/ addct; alternating LAQ; cycling (some R hip pain) - standing hamstring stretch with heel on 3rd step x 15s  -figure 4 stretch ue support on hand rails    Pt requires the buoyancy and hydrostatic pressure of water for support, and to offload joints by unweighting joint load by at least 50 % in navel deep water and by at least 75-80% in chest to neck deep water.  Viscosity of the water is needed for resistance of strengthening. Water current perturbations provides challenge to standing balance requiring increased core activation.      PATIENT EDUCATION:  Education details: reacquainting with aquatic therapy, 09/02/23: HEP  Person educated: Patient Education method: Explanation and Handouts Education  comprehension: verbalized understanding  HOME EXERCISE PROGRAM: Access Code: [email or web address] URL: [email or web address] Date: 09/02/2023 Prepared by: Debria Fang Zhamir Pirro  Exercises - Standing Hip Abduction with Counter Support  - 1 x daily - 7 x weekly - 2 sets - 10 reps - Standing Hip Extension with Counter Support  - 1 x daily - 7 x weekly - 2 sets - 10  reps - Seated Long Arc Quad  - 1 x daily - 7 x weekly - 1-2 sets - 10 reps - 5 second hold  ASSESSMENT:  CLINICAL IMPRESSION: Began core/hip/LE strengthening on land today which is tolerated well. Intermittent cueing for mechanics and control.  Patient with difficulty with mechanics with squat with tendency for anterior lean, elevated seat STS tolerated well. Fatigue 6/10 at EOS. Patient will continue to benefit from physical therapy in order to improve function and reduce impairment.   From initial evaluation:  Patient is a 78 y.o. f who was seen today for physical therapy evaluation and treatment for LBP.  Pt presents with gait deviation due to pain in bilateral knees (R>L) and LBP left lumbar area with radicular pattern into left leg to knee.  She demonstrates a strength deficit and is a high fall risk as per Randye Buttner.  She has been told she is not a surgical candidate for back but is for a TKR which she has been delaying in attempts to avoid. She has seen many Mds of the same specialty trying to get a consensus of forward treatment on LB but has not had any success other than being referred over to aquatic PT. She does have an appt to see her neurologist next week. She is a good candidate for skill PT intervention both aquatic and land to progress her toward improvement in functional mobility and Adl's as well as decreasing fall risk.   OBJECTIVE IMPAIRMENTS: Abnormal gait, decreased activity tolerance, decreased balance, decreased knowledge of use of DME, decreased mobility, decreased ROM, decreased strength, and pain.   ACTIVITY  LIMITATIONS: lifting, bending, squatting, sleeping, stairs, transfers, and locomotion level  PARTICIPATION LIMITATIONS: meal prep, cleaning, shopping, community activity, and yard work  PERSONAL FACTORS: Time since onset of injury/illness/exacerbation and 1-2 comorbidities: see PmHx are also affecting patient's functional outcome.   REHAB POTENTIAL: Good  CLINICAL DECISION MAKING: Evolving/moderate complexity  EVALUATION COMPLEXITY: Moderate   GOALS: Goals reviewed with patient? Yes  SHORT TERM GOALS: Target date: 08/21/23  Pt will tolerate full aquatic sessions consistently without increase in pain and with improving function to demonstrate good toleration and effectiveness of intervention.  Baseline: Goal status: MET - 08/29/23  2.  Pt will return to regular exercise weekly Baseline:  Goal status: INITIAL  3.  Pt will tolerate stair climbing using  combination of alternating and step to pattern ascending and descending 6 steps without use of handrail  Baseline:  Goal status: INITIAL  4.  Pt will report good toleration to gardening without excessive pain Baseline:  Goal status: INITIAL    LONG TERM GOALS: Target date: 09/26/23  Pt to improve on ODI by 13-15% to demonstrate statistically significant Improvement in function. Baseline: 22/50=44% Goal status: INITIAL  2.  Pt will be indep with final HEP's (land and aquatic as appropriate) for continued management of condition Baseline:  Goal status: INITIAL  3.  Pt will improve strength in hips by at least 5 lbs to demonstrate improved overall physical function Baseline:  Goal status: INITIAL  4.  Pt will improve on Berg balance test to >/= 45/56 to demonstrate a decrease in fall risk. Baseline:32/56  Goal status: INITIAL  5.  Pt will report decrease in pain by at least 50% for improved toleration to activity/quality of life and to demonstrate improved management of pain. Baseline: see chart Goal status:  INITIAL  PLAN:  PT FREQUENCY: 2x/week  PT DURATION: 8 weeks  first 6 pool only then  remainder alternating land and water for optimal outcome  PLANNED INTERVENTIONS: 97164- PT Re-evaluation, 97110-Therapeutic exercises, 97530- Therapeutic activity, 97112- Neuromuscular re-education, 97535- Self Care, 16109- Manual therapy, [phone]- Gait training, [phone]- Orthotic Initial, [phone]- Aquatic Therapy, [phone]- Ionotophoresis 4mg /ml Dexamethasone , Patient/Family education, Balance training, Stair training, Taping, Dry Needling, Joint mobilization, DME instructions, Cryotherapy, and Moist heat.  PLAN FOR NEXT SESSION: Land/aquatic: le and core strengthening, gait training, balance retraining, transitional movements/STS transfers; activity toleration. Use of AD as approp    Beather Liming, PT 09/02/2023, 10:13 AM  [facility] [address] [place], [place], [address] Phone: [phone]   Fax:  [phone]   Date of referral: 06/19/23 Referring provider: [facility] Referring diagnosis? LBP Treatment diagnosis? (if different than referring diagnosis) LBP and Knee pain  What was this (referring dx) caused by? Ongoing Issue and Arthritis  Lonne Roan of Condition: Chronic (continuous duration > 3 months)   Laterality: Both  Current Functional Measure Score: Other ODI 44%  Objective measurements identify impairments when they are compared to normal values, the uninvolved extremity, and prior level of function.  [x]  Yes  []  No  Objective assessment of functional ability: Moderate functional limitations   Briefly describe symptoms: Pain lb radicularr pattern into lle; right knee pain  How did symptoms start: chronic  Average pain intensity:  Last 24 hours: 7/10  Past week: 7/10  How often does the pt experience symptoms? Constantly  How much have the symptoms interfered with usual daily activities? Quite a bit  How has condition changed  since care began at this facility? NA - initial visit  In general, how is the patients overall health? Good   BACK PAIN (STarT Back Screening Tool) Has pain spread down the leg(s) at some time in the last 2 weeks? yes Has the pt only walked short distances because of back pain? yes Does patient think it's not safe for a person with this condition to be physically active? yes Does patient feel back pain is terrible and will never get any better? yes Has patient stopped enjoying things they usually enjoy? yes

## 2023-09-05 ENCOUNTER — Encounter (HOSPITAL_BASED_OUTPATIENT_CLINIC_OR_DEPARTMENT_OTHER): Payer: Self-pay

## 2023-09-05 ENCOUNTER — Ambulatory Visit (HOSPITAL_BASED_OUTPATIENT_CLINIC_OR_DEPARTMENT_OTHER): Admitting: Physical Therapy

## 2023-09-09 ENCOUNTER — Ambulatory Visit (HOSPITAL_BASED_OUTPATIENT_CLINIC_OR_DEPARTMENT_OTHER): Admitting: Physical Therapy

## 2023-09-09 ENCOUNTER — Encounter (HOSPITAL_BASED_OUTPATIENT_CLINIC_OR_DEPARTMENT_OTHER): Payer: Self-pay | Admitting: Physical Therapy

## 2023-09-09 DIAGNOSIS — M6281 Muscle weakness (generalized): Secondary | ICD-10-CM

## 2023-09-09 DIAGNOSIS — M5459 Other low back pain: Secondary | ICD-10-CM | POA: Diagnosis not present

## 2023-09-09 DIAGNOSIS — G8929 Other chronic pain: Secondary | ICD-10-CM

## 2023-09-09 DIAGNOSIS — R2681 Unsteadiness on feet: Secondary | ICD-10-CM

## 2023-09-09 NOTE — Therapy (Signed)
 OUTPATIENT PHYSICAL THERAPY THORACOLUMBAR TREATMENT   Patient Name: Autumn Paul MRN: [id number] DOB:03-Apr-1945, 78 y.o., female Today's Date: 09/09/2023  END OF SESSION:  PT End of Session - 09/09/23 0933     Visit Number 7    Number of Visits 16    Date for PT Re-Evaluation 09/26/23    Authorization Type [employer]    Authorization Time Period 07/30/23-09/24/23    Authorization - Visit Number 6    Authorization - Number of Visits 6    Progress Note Due on Visit 10    PT Start Time 0933    PT Stop Time 1012    PT Time Calculation (min) 39 min    Activity Tolerance Patient tolerated treatment well    Behavior During Therapy WFL for tasks assessed/performed           Past Medical History:  Diagnosis Date   Fibromyalgia    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    uses Timolol   Hyperlipidemia    Osteoporosis    Spinal stenosis    Past Surgical History:  Procedure Laterality Date   CARPAL TUNNEL RELEASE     right wrist   CATARACT EXTRACTION     COLONOSCOPY     RHINOPLASTY     TUBAL LIGATION     Patient Active Problem List   Diagnosis Date Noted   After cataract of left eye not obscuring vision 07/26/2014   Glaucoma suspect, both eyes 08/02/2013   Conjunctivochalasis 04/07/2012   Dermatochalasis 04/07/2012   Dry eyes 04/07/2012   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   Meibomian gland dysfunction 04/07/2012   Myopia with astigmatism and presbyopia 04/07/2012   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   Posterior vitreous detachment 04/07/2012   Vitreous syneresis 04/07/2012    PCP: Elsie Gentry MD  REFERRING PROVIDER:   Beuford Anes, MD    REFERRING DIAG: M54.50 (ICD-10-CM) - Low back pain, unspecified   Rationale for Evaluation and Treatment: Rehabilitation  THERAPY DIAG:  Other low back pain  Muscle weakness (generalized)  Chronic pain of right knee  Unsteadiness on feet  ONSET DATE: exacerbating for last 6 months  SUBJECTIVE:                                                                                                                                                                                            SUBJECTIVE STATEMENT: Pt reports issues with R knees. R knee irritated following steps. L knee feeling good. Back is pretty stiff in the morning. Topical gel used for knee pain which helps.   POOL ACCESS: considering joining [employer].  From evaluation:  I have had back problems for years.  Have bilateral knee pain need an OA.  Saw Dr Beuford for my back and he said I was not a surgical candidate.  I also saw Dr Burnetta but didn't really think he did much.  I do have an appt with a nuero guy Dr Joshua next week.  Not sure which one is making the other worse knee vs back.  Had knee injections  which help some but the back injection didn't help.  Stopped with exercising 4 x a week back 6 months ago due to back pain  PERTINENT HISTORY:  history of hyperlipidemia glaucoma, fibromyalgia, spinal stenosis. With recent spinal injection.  Went to ER 5/4 for LBP with sciatica  PAIN:  Are you having pain? Yes: NPRS scale: 0/10 sitting, walking 6/10 Pain location: R knee Pain description: achy Aggravating factors: standing/weight bearing; working in garden 30 min Relieving factors: sitting, oxycodone     PRECAUTIONS: None  RED FLAGS: None   WEIGHT BEARING RESTRICTIONS: No  FALLS:  Has patient fallen in last 6 months? No  LIVING ENVIRONMENT: Lives with: lives with their spouse Lives in: House/apartment  OCCUPATION: retired  PLOF: Independent  PATIENT GOALS: decrease pain, walk longer  NEXT MD VISIT: Dr Joshua Next week  OBJECTIVE:  Note: Objective measures were completed at Evaluation unless otherwise noted.  DIAGNOSTIC FINDINGS:  X-ray hip MPRESSION: 1. No acute findings. 2. Lumbar spondylosis.  PATIENT SURVEYS:  22/50=44%  COGNITION: Overall cognitive status: Within functional limits for  tasks assessed      MUSCLE LENGTH: Hamstrings: tested in sitting tight R>L   POSTURE: rounded shoulders, forward head, decreased lumbar lordosis, and weight shift left  PALPATION: TTP throughout lower thoracic and lumbar pain left glut. Crepitus bilat patella  LUMBAR ROM:   AROM eval  Flexion full  Extension Neutral P!  Right lateral flexion Full P!  Left lateral flexion Full P!  Right rotation   Left rotation    (Blank rows = not tested)  LOWER EXTREMITY ROM:     wfl  LOWER EXTREMITY MMT:    HD (lbs) Tested in sitting Right eval Left eval  Hip flexion 32.7 33.2  Hip extension    Hip abduction 23.0 21.2  Hip adduction    Hip internal rotation    Hip external rotation    Knee flexion    Knee extension 34.2 22.5  Ankle dorsiflexion    Ankle plantarflexion    Ankle inversion    Ankle eversion     (Blank rows = not tested)  LUMBAR SPECIAL TESTS:  Slump test: Negative  FUNCTIONAL TESTS:  5 times sit to stand: 20.96 Timed up and go (TUG): 19.04  BERG Balance Test          Date: eval  Sit to Stand 3  Standing unsupported 4  Sitting with back unsupported but feet supported 4  Stand to sit  2  Transfers  3  Standing unsupported with eyes closed 2  Standing unsupported feet together 3  From standing position, reach forward with outstretched arm 3  From standing position, pick up object from floor 3  From standing position, turn and look behind over each shoulder 2  Turn 360 2  Standing unsupported, alternately place foot on step 1  Standing unsupported, one foot in front 0  Standing on one leg 0  Total:  32/56     GAIT: Distance walked: 400 ft Assistive device utilized: None Level of assistance:  Complete Independence Comments: Increased knee flex/step height (march like), trunk guarding and antalgic limp through LLE  TREATMENT                                                                                                                          09/09/23 Standing hip abduction RTB at knees 2 x 10 Standing hip extension RTB at knees 2 x 10 Step up 4 inch 2 x 10 Tandem stance 3 x 30 second holds STS with 2 airex 2 x 10  Standing palof press RTB 2 x 10 Nustep 2 minutes level 5  09/02/23 Standing hip abduction 2 x 10 Standing hip extension 2 x 10 Step up 4 inch 2 x 10 Standing row GTB 2 x 10 Standing shoulder extension GTB 2 x 10 LAQ 1 x 10 with 5 second holds Mini squat 1 x 10 - difficulty with mechanics STS with 2 airex 2 x 10    [facility] Adult PT Treatment:                                             09/01/23 Pt seen for aquatic therapy today.  Treatment took place in water 3.5-4.75 ft in depth at the [facility] pool. Temp of water was 91.  Pt entered/exited the pool via stairs independently with bilat rail.  - unsupported walking forward/ backward - cues for even step length - suitcase carry marching backward/forward with single yellow hand floats at side-> bilat rainbow hand floats - tandem gait forward/ backward with UE on yellow hand floats, cues to slow spped - UE on yellow hand floats:  LE swings into hip abdct/ addct 2 x 10 (cues to control height of leg), hip flex/ext x 10 each LE - in 38ft6: SLS x 15sec x 2 reps LLE, 1 RLE (very difficult on LLE, hops to steady) - walking forward for rest and recovery  - arms crossed at chest: 3 way toe tap x 10 each LE - grapevine R/L x 2 - squats pushing single rainbow -> yellow hand float under water x 15 - seated on bench in water: cycling, hip abdct/ addct; alternating LAQ; cycling (some R hip pain) - standing hamstring stretch with heel on 3rd step x 15s  -figure 4 stretch ue support on hand rails    Pt requires the buoyancy and hydrostatic pressure of water for support, and to offload joints by unweighting joint load by at least 50 % in navel deep water and by at least 75-80% in chest to neck deep water.  Viscosity of the water is needed for resistance of  strengthening. Water current perturbations provides challenge to standing balance requiring increased core activation.      PATIENT EDUCATION:  Education details: reacquainting with aquatic therapy, 09/02/23: HEP  Person educated: Patient Education method: Explanation and Handouts Education comprehension: verbalized understanding  HOME EXERCISE PROGRAM: Access Code: [email or web address] URL: [email or web address] Date: 09/02/2023 Prepared by: Prentice Kaimani Clayson  Exercises - Standing Hip Abduction with Counter Support  - 1 x daily - 7 x weekly - 2 sets - 10 reps - Standing Hip Extension with Counter Support  - 1 x daily - 7 x weekly - 2 sets - 10 reps - Seated Long Arc Quad  - 1 x daily - 7 x weekly - 1-2 sets - 10 reps - 5 second hold  ASSESSMENT:  CLINICAL IMPRESSION: Patient able to complete standing hip exercises with increased resistance today. Continued with functional strengthening. Moderate unsteadiness with static balance requiring frequent UE support. Patient will continue to benefit from physical therapy in order to improve function and reduce impairment.   From initial evaluation:  Patient is a 78 y.o. f who was seen today for physical therapy evaluation and treatment for LBP.  Pt presents with gait deviation due to pain in bilateral knees (R>L) and LBP left lumbar area with radicular pattern into left leg to knee.  She demonstrates a strength deficit and is a high fall risk as per Lars.  She has been told she is not a surgical candidate for back but is for a TKR which she has been delaying in attempts to avoid. She has seen many Mds of the same specialty trying to get a consensus of forward treatment on LB but has not had any success other than being referred over to aquatic PT. She does have an appt to see her neurologist next week. She is a good candidate for skill PT intervention both aquatic and land to progress her toward improvement in functional mobility and Adl's as  well as decreasing fall risk.   OBJECTIVE IMPAIRMENTS: Abnormal gait, decreased activity tolerance, decreased balance, decreased knowledge of use of DME, decreased mobility, decreased ROM, decreased strength, and pain.   ACTIVITY LIMITATIONS: lifting, bending, squatting, sleeping, stairs, transfers, and locomotion level  PARTICIPATION LIMITATIONS: meal prep, cleaning, shopping, community activity, and yard work  PERSONAL FACTORS: Time since onset of injury/illness/exacerbation and 1-2 comorbidities: see PmHx are also affecting patient's functional outcome.   REHAB POTENTIAL: Good  CLINICAL DECISION MAKING: Evolving/moderate complexity  EVALUATION COMPLEXITY: Moderate   GOALS: Goals reviewed with patient? Yes  SHORT TERM GOALS: Target date: 08/21/23  Pt will tolerate full aquatic sessions consistently without increase in pain and with improving function to demonstrate good toleration and effectiveness of intervention.  Baseline: Goal status: MET - 08/29/23  2.  Pt will return to regular exercise weekly Baseline:  Goal status: INITIAL  3.  Pt will tolerate stair climbing using  combination of alternating and step to pattern ascending and descending 6 steps without use of handrail  Baseline:  Goal status: INITIAL  4.  Pt will report good toleration to gardening without excessive pain Baseline:  Goal status: INITIAL    LONG TERM GOALS: Target date: 09/26/23  Pt to improve on ODI by 13-15% to demonstrate statistically significant Improvement in function. Baseline: 22/50=44% Goal status: INITIAL  2.  Pt will be indep with final HEP's (land and aquatic as appropriate) for continued management of condition Baseline:  Goal status: INITIAL  3.  Pt will improve strength in hips by at least 5 lbs to demonstrate improved overall physical function Baseline:  Goal status: INITIAL  4.  Pt will improve on Berg balance test to >/= 45/56 to demonstrate a decrease in fall  risk. Baseline:32/56  Goal status: INITIAL  5.  Pt will  report decrease in pain by at least 50% for improved toleration to activity/quality of life and to demonstrate improved management of pain. Baseline: see chart Goal status: INITIAL  PLAN:  PT FREQUENCY: 2x/week  PT DURATION: 8 weeks  first 6 pool only then remainder alternating land and water for optimal outcome  PLANNED INTERVENTIONS: 97164- PT Re-evaluation, 97110-Therapeutic exercises, 97530- Therapeutic activity, 97112- Neuromuscular re-education, 97535- Self Care, [id number]- Manual therapy, [id number]- Gait training, [phone]- Orthotic Initial, [phone]- Aquatic Therapy, [phone]- Ionotophoresis 4mg /ml Dexamethasone , Patient/Family education, Balance training, Stair training, Taping, Dry Needling, Joint mobilization, DME instructions, Cryotherapy, and Moist heat.  PLAN FOR NEXT SESSION: Land/aquatic: le and core strengthening, gait training, balance retraining, transitional movements/STS transfers; activity toleration. Use of AD as approp    Prentice GORMAN Stains, PT, DPT 09/09/2023, 10:15 AM  [facility] [address] [place], [place], [address] Phone: [phone]   Fax:  [phone]   Date of referral: 06/19/23 Referring provider: Beuford Referring diagnosis? LBP Treatment diagnosis? (if different than referring diagnosis) LBP and Knee pain  What was this (referring dx) caused by? Ongoing Issue and Arthritis  Lysle of Condition: Chronic (continuous duration > 3 months)   Laterality: Both  Current Functional Measure Score: Other ODI 44%  Objective measurements identify impairments when they are compared to normal values, the uninvolved extremity, and prior level of function.  [x]  Yes  []  No  Objective assessment of functional ability: Moderate functional limitations   Briefly describe symptoms: Pain lb radicularr pattern into lle; right knee pain  How did symptoms start:  chronic  Average pain intensity:  Last 24 hours: 7/10  Past week: 7/10  How often does the pt experience symptoms? Constantly  How much have the symptoms interfered with usual daily activities? Quite a bit  How has condition changed since care began at this facility? NA - initial visit  In general, how is the patients overall health? Good   BACK PAIN (STarT Back Screening Tool) Has pain spread down the leg(s) at some time in the last 2 weeks? yes Has the pt only walked short distances because of back pain? yes Does patient think it's not safe for a person with this condition to be physically active? yes Does patient feel back pain is terrible and will never get any better? yes Has patient stopped enjoying things they usually enjoy? yes

## 2023-09-12 ENCOUNTER — Ambulatory Visit (HOSPITAL_BASED_OUTPATIENT_CLINIC_OR_DEPARTMENT_OTHER): Admitting: Physical Therapy

## 2023-09-12 ENCOUNTER — Encounter (HOSPITAL_BASED_OUTPATIENT_CLINIC_OR_DEPARTMENT_OTHER): Payer: Self-pay | Admitting: Physical Therapy

## 2023-09-12 DIAGNOSIS — M5459 Other low back pain: Secondary | ICD-10-CM

## 2023-09-12 DIAGNOSIS — G8929 Other chronic pain: Secondary | ICD-10-CM

## 2023-09-12 DIAGNOSIS — M6281 Muscle weakness (generalized): Secondary | ICD-10-CM

## 2023-09-12 DIAGNOSIS — R2681 Unsteadiness on feet: Secondary | ICD-10-CM

## 2023-09-12 NOTE — Therapy (Signed)
 OUTPATIENT PHYSICAL THERAPY THORACOLUMBAR TREATMENT   Patient Name: Autumn Paul MRN: [id number] DOB:30-Dec-1945, 78 y.o., female Today's Date: 09/12/2023  END OF SESSION:  PT End of Session - 09/12/23 0936     Visit Number 8    Number of Visits 16    Date for PT Re-Evaluation 09/26/23    Authorization Type [employer]    Authorization Time Period 07/30/23-09/24/23    Progress Note Due on Visit 10    PT Start Time 0928    PT Stop Time 1007    PT Time Calculation (min) 39 min    Activity Tolerance Patient tolerated treatment well    Behavior During Therapy WFL for tasks assessed/performed           Past Medical History:  Diagnosis Date   Fibromyalgia    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    uses Timolol   Hyperlipidemia    Osteoporosis    Spinal stenosis    Past Surgical History:  Procedure Laterality Date   CARPAL TUNNEL RELEASE     right wrist   CATARACT EXTRACTION     COLONOSCOPY     RHINOPLASTY     TUBAL LIGATION     Patient Active Problem List   Diagnosis Date Noted   After cataract of left eye not obscuring vision 07/26/2014   Glaucoma suspect, both eyes 08/02/2013   Conjunctivochalasis 04/07/2012   Dermatochalasis 04/07/2012   Dry eyes 04/07/2012   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   Meibomian gland dysfunction 04/07/2012   Myopia with astigmatism and presbyopia 04/07/2012   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   Posterior vitreous detachment 04/07/2012   Vitreous syneresis 04/07/2012    PCP: Elsie Gentry MD  REFERRING PROVIDER:   Beuford Anes, MD    REFERRING DIAG: M54.50 (ICD-10-CM) - Low back pain, unspecified   Rationale for Evaluation and Treatment: Rehabilitation  THERAPY DIAG:  Other low back pain  Muscle weakness (generalized)  Chronic pain of right knee  Unsteadiness on feet  ONSET DATE: exacerbating for last 6 months  SUBJECTIVE:                                                                                                                                                                                            SUBJECTIVE STATEMENT: Pt reports increased pain in R knee since last land visit. Cancelled last appt due to high pain level. She is still feeling relief from injection on L lower back 2 wks ago.   POOL ACCESS: considering joining [employer].   From evaluation:  I have had back problems for years.  Have bilateral knee  pain need an OA.  Saw Dr Beuford for my back and he said I was not a surgical candidate.  I also saw Dr Burnetta but didn't really think he did much.  I do have an appt with a nuero guy Dr Joshua next week.  Not sure which one is making the other worse knee vs back.  Had knee injections  which help some but the back injection didn't help.  Stopped with exercising 4 x a week back 6 months ago due to back pain  PERTINENT HISTORY:  history of hyperlipidemia glaucoma, fibromyalgia, spinal stenosis. With recent spinal injection.  Went to ER 5/4 for LBP with sciatica  PAIN:  Are you having pain? Yes: NPRS scale: 7/10 R knee,  2/10 lower back  Pain location: see above  Pain description: achy Aggravating factors: standing/weight bearing; working in garden 30 min Relieving factors: sitting, oxycodone     PRECAUTIONS: None  RED FLAGS: None   WEIGHT BEARING RESTRICTIONS: No  FALLS:  Has patient fallen in last 6 months? No  LIVING ENVIRONMENT: Lives with: lives with their spouse Lives in: House/apartment  OCCUPATION: retired  PLOF: Independent  PATIENT GOALS: decrease pain, walk longer  NEXT MD VISIT: Dr Joshua Next week  OBJECTIVE:  Note: Objective measures were completed at Evaluation unless otherwise noted.  DIAGNOSTIC FINDINGS:  X-ray hip MPRESSION: 1. No acute findings. 2. Lumbar spondylosis.  PATIENT SURVEYS:  22/50=44%  COGNITION: Overall cognitive status: Within functional limits for tasks assessed      MUSCLE LENGTH: Hamstrings: tested in  sitting tight R>L   POSTURE: rounded shoulders, forward head, decreased lumbar lordosis, and weight shift left  PALPATION: TTP throughout lower thoracic and lumbar pain left glut. Crepitus bilat patella  LUMBAR ROM:   AROM eval  Flexion full  Extension Neutral P!  Right lateral flexion Full P!  Left lateral flexion Full P!  Right rotation   Left rotation    (Blank rows = not tested)  LOWER EXTREMITY ROM:     wfl  LOWER EXTREMITY MMT:    HD (lbs) Tested in sitting Right eval Left eval  Hip flexion 32.7 33.2  Hip extension    Hip abduction 23.0 21.2  Hip adduction    Hip internal rotation    Hip external rotation    Knee flexion    Knee extension 34.2 22.5  Ankle dorsiflexion    Ankle plantarflexion    Ankle inversion    Ankle eversion     (Blank rows = not tested)  LUMBAR SPECIAL TESTS:  Slump test: Negative  FUNCTIONAL TESTS:  5 times sit to stand: 20.96 Timed up and go (TUG): 19.04  BERG Balance Test          Date: eval  Sit to Stand 3  Standing unsupported 4  Sitting with back unsupported but feet supported 4  Stand to sit  2  Transfers  3  Standing unsupported with eyes closed 2  Standing unsupported feet together 3  From standing position, reach forward with outstretched arm 3  From standing position, pick up object from floor 3  From standing position, turn and look behind over each shoulder 2  Turn 360 2  Standing unsupported, alternately place foot on step 1  Standing unsupported, one foot in front 0  Standing on one leg 0  Total:  32/56     GAIT: Distance walked: 400 ft Assistive device utilized: None Level of assistance: Complete Independence Comments: Increased knee flex/step height (march like),  trunk guarding and antalgic limp through LLE  TREATMENT                                                                                                                         [facility] Adult PT Treatment:                                              09/12/23 Pt seen for aquatic therapy today.  Treatment took place in water 3.5-4.75 ft in depth at the [facility] pool. Temp of water was 91.  Pt entered/exited the pool via stairs independently with bilat rail.  - unsupported walking forward/ backward - cues for even step length - side stepping without support -> with arm addct / abdct with rainbow hand floats  - suitcase carry marching backward/forward with single/ bilat rainbow hand floats at side, multiple reps - UE on rainbow hand floats: 3 way LE kick (toe tap when in ext) x 5 each LE   - high knee marching forward/ backward with alternating row with hand floats - TrA set in staggered stance with full hollow noodle pull down to thighs x 10 each, cues to slow speed  - tandem gait forward/ backward with UE on noodle, cues to slow spped - straddling noodle and UE support on corner: cycling, hip abdct/addct  - standing hamstring stretch with heel on 3rd step x 15s  -bilat gastroc stretch with heels off of step x 20s, cues for posture and technique  09/09/23 Standing hip abduction RTB at knees 2 x 10 Standing hip extension RTB at knees 2 x 10 Step up 4 inch 2 x 10 Tandem stance 3 x 30 second holds STS with 2 airex 2 x 10  Standing palof press RTB 2 x 10 Nustep 2 minutes level 5  09/02/23 Standing hip abduction 2 x 10 Standing hip extension 2 x 10 Step up 4 inch 2 x 10 Standing row GTB 2 x 10 Standing shoulder extension GTB 2 x 10 LAQ 1 x 10 with 5 second holds Mini squat 1 x 10 - difficulty with mechanics STS with 2 airex 2 x 10    [facility] Adult PT Treatment:                                             09/01/23 Pt seen for aquatic therapy today.  Treatment took place in water 3.5-4.75 ft in depth at the [facility] pool. Temp of water was 91.  Pt entered/exited the pool via stairs independently with bilat rail.  - unsupported walking forward/ backward - cues for even step length - suitcase carry marching  backward/forward with single yellow hand floats at side-> bilat rainbow hand floats - tandem gait forward/  backward with UE on yellow hand floats, cues to slow spped - UE on yellow hand floats:  LE swings into hip abdct/ addct 2 x 10 (cues to control height of leg), hip flex/ext x 10 each LE - in 34ft6: SLS x 15sec x 2 reps LLE, 1 RLE (very difficult on LLE, hops to steady) - walking forward for rest and recovery  - arms crossed at chest: 3 way toe tap x 10 each LE - grapevine R/L x 2 - squats pushing single rainbow -> yellow hand float under water x 15 - seated on bench in water: cycling, hip abdct/ addct; alternating LAQ; cycling (some R hip pain) - standing hamstring stretch with heel on 3rd step x 15s  -figure 4 stretch ue support on hand rails    Pt requires the buoyancy and hydrostatic pressure of water for support, and to offload joints by unweighting joint load by at least 50 % in navel deep water and by at least 75-80% in chest to neck deep water.  Viscosity of the water is needed for resistance of strengthening. Water current perturbations provides challenge to standing balance requiring increased core activation.      PATIENT EDUCATION:  Education details: reacquainting with aquatic therapy, 09/02/23: HEP  Person educated: Patient Education method: Explanation and Handouts Education comprehension: verbalized understanding  HOME EXERCISE PROGRAM: Access Code: [email or web address] URL: [email or web address] Date: 09/02/2023 Prepared by: Prentice Zaunegger  Exercises - Standing Hip Abduction with Counter Support  - 1 x daily - 7 x weekly - 2 sets - 10 reps - Standing Hip Extension with Counter Support  - 1 x daily - 7 x weekly - 2 sets - 10 reps - Seated Long Arc Quad  - 1 x daily - 7 x weekly - 1-2 sets - 10 reps - 5 second hold  ASSESSMENT:  CLINICAL IMPRESSION: Pt requires frequent cues to slow the speed of exercise in water.She is challenged with SLS and tandem  stance exercises. She reported increased R knee pain with increased flexion (pz:fjmrypwh), but also some relief of pain in R knee reported by end of session. Pt has not decided which pool she will join; encouraged pt to explore area pools in coming days to decided.  Will begin putting HEP together for her to transition to independent exercise.  Patient will continue to benefit from physical therapy in order to improve function and reduce impairment.     From initial evaluation:  Patient is a 78 y.o. f who was seen today for physical therapy evaluation and treatment for LBP.  Pt presents with gait deviation due to pain in bilateral knees (R>L) and LBP left lumbar area with radicular pattern into left leg to knee.  She demonstrates a strength deficit and is a high fall risk as per Lars.  She has been told she is not a surgical candidate for back but is for a TKR which she has been delaying in attempts to avoid. She has seen many Mds of the same specialty trying to get a consensus of forward treatment on LB but has not had any success other than being referred over to aquatic PT. She does have an appt to see her neurologist next week. She is a good candidate for skill PT intervention both aquatic and land to progress her toward improvement in functional mobility and Adl's as well as decreasing fall risk.   OBJECTIVE IMPAIRMENTS: Abnormal gait, decreased activity tolerance, decreased balance, decreased knowledge of use of DME, decreased mobility, decreased  ROM, decreased strength, and pain.   ACTIVITY LIMITATIONS: lifting, bending, squatting, sleeping, stairs, transfers, and locomotion level  PARTICIPATION LIMITATIONS: meal prep, cleaning, shopping, community activity, and yard work  PERSONAL FACTORS: Time since onset of injury/illness/exacerbation and 1-2 comorbidities: see PmHx are also affecting patient's functional outcome.   REHAB POTENTIAL: Good  CLINICAL DECISION MAKING: Evolving/moderate  complexity  EVALUATION COMPLEXITY: Moderate   GOALS: Goals reviewed with patient? Yes  SHORT TERM GOALS: Target date: 08/21/23  Pt will tolerate full aquatic sessions consistently without increase in pain and with improving function to demonstrate good toleration and effectiveness of intervention.  Baseline: Goal status: MET - 08/29/23  2.  Pt will return to regular exercise weekly Baseline:  Goal status: INITIAL  3.  Pt will tolerate stair climbing using  combination of alternating and step to pattern ascending and descending 6 steps without use of handrail  Baseline:  Goal status: In progress   4.  Pt will report good toleration to gardening without excessive pain Baseline:  Goal status: INITIAL    LONG TERM GOALS: Target date: 09/26/23  Pt to improve on ODI by 13-15% to demonstrate statistically significant Improvement in function. Baseline: 22/50=44% Goal status: INITIAL  2.  Pt will be indep with final HEP's (land and aquatic as appropriate) for continued management of condition Baseline:  Goal status: INITIAL  3.  Pt will improve strength in hips by at least 5 lbs to demonstrate improved overall physical function Baseline:  Goal status: INITIAL  4.  Pt will improve on Berg balance test to >/= 45/56 to demonstrate a decrease in fall risk. Baseline:32/56  Goal status: INITIAL  5.  Pt will report decrease in pain by at least 50% for improved toleration to activity/quality of life and to demonstrate improved management of pain. Baseline: see chart Goal status: INITIAL  PLAN:  PT FREQUENCY: 2x/week  PT DURATION: 8 weeks  first 6 pool only then remainder alternating land and water for optimal outcome  PLANNED INTERVENTIONS: [id number]- PT Re-evaluation, 97110-Therapeutic exercises, 97530- Therapeutic activity, 97112- Neuromuscular re-education, 97535- Self Care, [id number]- Manual therapy, [id number]- Gait training, [phone]- Orthotic Initial, [phone]- Aquatic Therapy, [phone]-  Ionotophoresis 4mg /ml Dexamethasone , Patient/Family education, Balance training, Stair training, Taping, Dry Needling, Joint mobilization, DME instructions, Cryotherapy, and Moist heat.  PLAN FOR NEXT SESSION: Land/aquatic: le and core strengthening, gait training, balance retraining, transitional movements/STS transfers; activity toleration. Use of AD as approp   Delon Aquas, PTA 09/12/23 4:23 PM [facility] [address] [place], [place], [address] Phone: [phone]   Fax:  [phone]   Date of referral: 06/19/23 Referring provider: [facility] Referring diagnosis? LBP Treatment diagnosis? (if different than referring diagnosis) LBP and Knee pain  What was this (referring dx) caused by? Ongoing Issue and Arthritis  Lysle of Condition: Chronic (continuous duration > 3 months)   Laterality: Both  Current Functional Measure Score: Other ODI 44%  Objective measurements identify impairments when they are compared to normal values, the uninvolved extremity, and prior level of function.  [x]  Yes  []  No  Objective assessment of functional ability: Moderate functional limitations   Briefly describe symptoms: Pain lb radicularr pattern into lle; right knee pain  How did symptoms start: chronic  Average pain intensity:  Last 24 hours: 7/10  Past week: 7/10  How often does the pt experience symptoms? Constantly  How much have the symptoms interfered with usual daily activities? Quite a bit  How has condition changed since care began at  this facility? NA - initial visit  In general, how is the patients overall health? Good   BACK PAIN (STarT Back Screening Tool) Has pain spread down the leg(s) at some time in the last 2 weeks? yes Has the pt only walked short distances because of back pain? yes Does patient think it's not safe for a person with this condition to be physically active? yes Does patient feel back pain  is terrible and will never get any better? yes Has patient stopped enjoying things they usually enjoy? yes

## 2023-09-16 ENCOUNTER — Encounter (HOSPITAL_BASED_OUTPATIENT_CLINIC_OR_DEPARTMENT_OTHER): Admitting: Physical Therapy

## 2023-09-18 ENCOUNTER — Encounter (HOSPITAL_BASED_OUTPATIENT_CLINIC_OR_DEPARTMENT_OTHER): Payer: Self-pay | Admitting: Physical Therapy

## 2023-09-18 ENCOUNTER — Ambulatory Visit (HOSPITAL_BASED_OUTPATIENT_CLINIC_OR_DEPARTMENT_OTHER): Attending: Internal Medicine | Admitting: Physical Therapy

## 2023-09-18 DIAGNOSIS — R2681 Unsteadiness on feet: Secondary | ICD-10-CM | POA: Insufficient documentation

## 2023-09-18 DIAGNOSIS — M6281 Muscle weakness (generalized): Secondary | ICD-10-CM | POA: Insufficient documentation

## 2023-09-18 DIAGNOSIS — M5459 Other low back pain: Secondary | ICD-10-CM | POA: Insufficient documentation

## 2023-09-18 DIAGNOSIS — M25561 Pain in right knee: Secondary | ICD-10-CM | POA: Insufficient documentation

## 2023-09-18 DIAGNOSIS — G8929 Other chronic pain: Secondary | ICD-10-CM | POA: Diagnosis present

## 2023-09-18 NOTE — Therapy (Signed)
 OUTPATIENT PHYSICAL THERAPY THORACOLUMBAR TREATMENT   Patient Name: Mizani Dilday Asher MRN: [id number] DOB:1945-07-20, 78 y.o., female Today's Date: 09/18/2023  END OF SESSION:  PT End of Session - 09/18/23 1252     Number of Visits 16    Date for PT Re-Evaluation 09/26/23    Authorization Type [employer]    Authorization Time Period 8 visits approved   09/09/23-11/04/23    Authorization - Visit Number 1    Authorization - Number of Visits 8    Progress Note Due on Visit 10    PT Start Time 0935    PT Stop Time 1014    PT Time Calculation (min) 39 min    Activity Tolerance Patient tolerated treatment well    Behavior During Therapy WFL for tasks assessed/performed            Past Medical History:  Diagnosis Date   Fibromyalgia    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    uses Timolol   Hyperlipidemia    Osteoporosis    Spinal stenosis    Past Surgical History:  Procedure Laterality Date   CARPAL TUNNEL RELEASE     right wrist   CATARACT EXTRACTION     COLONOSCOPY     RHINOPLASTY     TUBAL LIGATION     Patient Active Problem List   Diagnosis Date Noted   After cataract of left eye not obscuring vision 07/26/2014   Glaucoma suspect, both eyes 08/02/2013   Conjunctivochalasis 04/07/2012   Dermatochalasis 04/07/2012   Dry eyes 04/07/2012   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   Meibomian gland dysfunction 04/07/2012   Myopia with astigmatism and presbyopia 04/07/2012   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   Posterior vitreous detachment 04/07/2012   Vitreous syneresis 04/07/2012    PCP: Elsie Gentry MD  REFERRING PROVIDER:   Beuford Anes, MD    REFERRING DIAG: M54.50 (ICD-10-CM) - Low back pain, unspecified   Rationale for Evaluation and Treatment: Rehabilitation  THERAPY DIAG:  Other low back pain  Muscle weakness (generalized)  Chronic pain of right knee  Unsteadiness on feet  ONSET DATE: exacerbating for last 6  months  SUBJECTIVE:                                                                                                                                                                                           SUBJECTIVE STATEMENT: Pt reports knee pain limiting function >LBP  POOL ACCESS: considering joining [employer].   From evaluation:  I have had back problems for years.  Have bilateral knee pain need an OA.  Saw Dr Beuford for my  back and he said I was not a surgical candidate.  I also saw Dr Burnetta but didn't really think he did much.  I do have an appt with a nuero guy Dr Joshua next week.  Not sure which one is making the other worse knee vs back.  Had knee injections  which help some but the back injection didn't help.  Stopped with exercising 4 x a week back 6 months ago due to back pain  PERTINENT HISTORY:  history of hyperlipidemia glaucoma, fibromyalgia, spinal stenosis. With recent spinal injection.  Went to ER 5/4 for LBP with sciatica  PAIN:  Are you having pain? Yes: NPRS scale: 7/10 R knee,  2/10 lower back  Pain location: see above  Pain description: achy Aggravating factors: standing/weight bearing; working in garden 30 min Relieving factors: sitting, oxycodone     PRECAUTIONS: None  RED FLAGS: None   WEIGHT BEARING RESTRICTIONS: No  FALLS:  Has patient fallen in last 6 months? No  LIVING ENVIRONMENT: Lives with: lives with their spouse Lives in: House/apartment  OCCUPATION: retired  PLOF: Independent  PATIENT GOALS: decrease pain, walk longer  NEXT MD VISIT: Dr Joshua Next week  OBJECTIVE:  Note: Objective measures were completed at Evaluation unless otherwise noted.  DIAGNOSTIC FINDINGS:  X-ray hip MPRESSION: 1. No acute findings. 2. Lumbar spondylosis.  PATIENT SURVEYS:  22/50=44%  COGNITION: Overall cognitive status: Within functional limits for tasks assessed      MUSCLE LENGTH: Hamstrings: tested in sitting tight R>L   POSTURE:  rounded shoulders, forward head, decreased lumbar lordosis, and weight shift left  PALPATION: TTP throughout lower thoracic and lumbar pain left glut. Crepitus bilat patella  LUMBAR ROM:   AROM eval  Flexion full  Extension Neutral P!  Right lateral flexion Full P!  Left lateral flexion Full P!  Right rotation   Left rotation    (Blank rows = not tested)  LOWER EXTREMITY ROM:     wfl  LOWER EXTREMITY MMT:    HD (lbs) Tested in sitting Right eval Left eval  Hip flexion 32.7 33.2  Hip extension    Hip abduction 23.0 21.2  Hip adduction    Hip internal rotation    Hip external rotation    Knee flexion    Knee extension 34.2 22.5  Ankle dorsiflexion    Ankle plantarflexion    Ankle inversion    Ankle eversion     (Blank rows = not tested)  LUMBAR SPECIAL TESTS:  Slump test: Negative  FUNCTIONAL TESTS:  5 times sit to stand: 20.96 Timed up and go (TUG): 19.04  BERG Balance Test          Date: eval  Sit to Stand 3  Standing unsupported 4  Sitting with back unsupported but feet supported 4  Stand to sit  2  Transfers  3  Standing unsupported with eyes closed 2  Standing unsupported feet together 3  From standing position, reach forward with outstretched arm 3  From standing position, pick up object from floor 3  From standing position, turn and look behind over each shoulder 2  Turn 360 2  Standing unsupported, alternately place foot on step 1  Standing unsupported, one foot in front 0  Standing on one leg 0  Total:  32/56     GAIT: Distance walked: 400 ft Assistive device utilized: None Level of assistance: Complete Independence Comments: Increased knee flex/step height (march like), trunk guarding and antalgic limp through LLE  TREATMENT  09/18/23 Nustep 4 minutes level 5 Standing hip abduction RTB at knees 2 x 10 Standing hip  extension RTB at knees 2 x 10 Supine SLR 2 x 10 PPT x 5 PPT with knee ext/flex; SLR Tandem stance 3 x 30 second holds     [facility] Adult PT Treatment:                                             09/12/23 Pt seen for aquatic therapy today.  Treatment took place in water 3.5-4.75 ft in depth at the [facility] pool. Temp of water was 91.  Pt entered/exited the pool via stairs independently with bilat rail.  - unsupported walking forward/ backward - cues for even step length - side stepping without support -> with arm addct / abdct with rainbow hand floats  - suitcase carry marching backward/forward with single/ bilat rainbow hand floats at side, multiple reps - UE on rainbow hand floats: 3 way LE kick (toe tap when in ext) x 5 each LE   - high knee marching forward/ backward with alternating row with hand floats - TrA set in staggered stance with full hollow noodle pull down to thighs x 10 each, cues to slow speed  - tandem gait forward/ backward with UE on noodle, cues to slow spped - straddling noodle and UE support on corner: cycling, hip abdct/addct  - standing hamstring stretch with heel on 3rd step x 15s  -bilat gastroc stretch with heels off of step x 20s, cues for posture and technique  09/09/23 Standing hip abduction RTB at knees 2 x 10 Standing hip extension RTB at knees 2 x 10 Step up 4 inch 2 x 10 Tandem stance 3 x 30 second holds STS with 2 airex 2 x 10  Standing palof press RTB 2 x 10 Nustep 2 minutes level 5  09/02/23 Standing hip abduction 2 x 10 Standing hip extension 2 x 10 Step up 4 inch 2 x 10 Standing row GTB 2 x 10 Standing shoulder extension GTB 2 x 10 LAQ 1 x 10 with 5 second holds Mini squat 1 x 10 - difficulty with mechanics STS with 2 airex 2 x 10    [facility] Adult PT Treatment:                                             09/01/23 Pt seen for aquatic therapy today.  Treatment took place in water 3.5-4.75 ft in depth at the [facility] pool.  Temp of water was 91.  Pt entered/exited the pool via stairs independently with bilat rail.  - unsupported walking forward/ backward - cues for even step length - suitcase carry marching backward/forward with single yellow hand floats at side-> bilat rainbow hand floats - tandem gait forward/ backward with UE on yellow hand floats, cues to slow spped - UE on yellow hand floats:  LE swings into hip abdct/ addct 2 x 10 (cues to control height of leg), hip flex/ext x 10 each LE - in 75ft6: SLS x 15sec x 2 reps LLE, 1 RLE (very difficult on LLE, hops to steady) - walking forward for rest and recovery  - arms crossed at chest: 3 way toe tap x 10 each LE - grapevine  R/L x 2 - squats pushing single rainbow -> yellow hand float under water x 15 - seated on bench in water: cycling, hip abdct/ addct; alternating LAQ; cycling (some R hip pain) - standing hamstring stretch with heel on 3rd step x 15s  -figure 4 stretch ue support on hand rails    Pt requires the buoyancy and hydrostatic pressure of water for support, and to offload joints by unweighting joint load by at least 50 % in navel deep water and by at least 75-80% in chest to neck deep water.  Viscosity of the water is needed for resistance of strengthening. Water current perturbations provides challenge to standing balance requiring increased core activation.      PATIENT EDUCATION:  Education details: reacquainting with aquatic therapy, 09/02/23: HEP  Person educated: Patient Education method: Explanation and Handouts Education comprehension: verbalized understanding  HOME EXERCISE PROGRAM: Access Code: [email or web address] URL: [email or web address] Date: 09/02/2023 Prepared by: Prentice Zaunegger  Exercises - Standing Hip Abduction with Counter Support  - 1 x daily - 7 x weekly - 2 sets - 10 reps - Standing Hip Extension with Counter Support  - 1 x daily - 7 x weekly - 2 sets - 10 reps - Seated Long Arc Quad  - 1 x daily - 7 x  weekly - 1-2 sets - 10 reps - 5 second hold  ASSESSMENT:  CLINICAL IMPRESSION: Pt seen land based as she confused sessions.  She reports LB tightness not really pain.  Knee pain limiting function >back pain.  Progressed core strengthening and balance with good toleration. Cues provided for pelvic positioning for stabilization with SLR. Goals ongoing.     From initial evaluation:  Patient is a 78 y.o. f who was seen today for physical therapy evaluation and treatment for LBP.  Pt presents with gait deviation due to pain in bilateral knees (R>L) and LBP left lumbar area with radicular pattern into left leg to knee.  She demonstrates a strength deficit and is a high fall risk as per Lars.  She has been told she is not a surgical candidate for back but is for a TKR which she has been delaying in attempts to avoid. She has seen many Mds of the same specialty trying to get a consensus of forward treatment on LB but has not had any success other than being referred over to aquatic PT. She does have an appt to see her neurologist next week. She is a good candidate for skill PT intervention both aquatic and land to progress her toward improvement in functional mobility and Adl's as well as decreasing fall risk.   OBJECTIVE IMPAIRMENTS: Abnormal gait, decreased activity tolerance, decreased balance, decreased knowledge of use of DME, decreased mobility, decreased ROM, decreased strength, and pain.   ACTIVITY LIMITATIONS: lifting, bending, squatting, sleeping, stairs, transfers, and locomotion level  PARTICIPATION LIMITATIONS: meal prep, cleaning, shopping, community activity, and yard work  PERSONAL FACTORS: Time since onset of injury/illness/exacerbation and 1-2 comorbidities: see PmHx are also affecting patient's functional outcome.   REHAB POTENTIAL: Good  CLINICAL DECISION MAKING: Evolving/moderate complexity  EVALUATION COMPLEXITY: Moderate   GOALS: Goals reviewed with patient? Yes  SHORT  TERM GOALS: Target date: 08/21/23  Pt will tolerate full aquatic sessions consistently without increase in pain and with improving function to demonstrate good toleration and effectiveness of intervention.  Baseline: Goal status: MET - 08/29/23  2.  Pt will return to regular exercise weekly Baseline:  Goal status: In progress 09/18/23  3.  Pt  will tolerate stair climbing using  combination of alternating and step to pattern ascending and descending 6 steps without use of handrail  Baseline:  Goal status: In progress 09/18/23  4.  Pt will report good toleration to gardening without excessive pain Baseline:  Goal status: in progress 09/18/23    LONG TERM GOALS: Target date: 09/26/23  Pt to improve on ODI by 13-15% to demonstrate statistically significant Improvement in function. Baseline: 22/50=44% Goal status: INITIAL  2.  Pt will be indep with final HEP's (land and aquatic as appropriate) for continued management of condition Baseline:  Goal status: INITIAL  3.  Pt will improve strength in hips by at least 5 lbs to demonstrate improved overall physical function Baseline:  Goal status: INITIAL  4.  Pt will improve on Berg balance test to >/= 45/56 to demonstrate a decrease in fall risk. Baseline:32/56  Goal status: INITIAL  5.  Pt will report decrease in pain by at least 50% for improved toleration to activity/quality of life and to demonstrate improved management of pain. Baseline: see chart Goal status: INITIAL  PLAN:  PT FREQUENCY: 2x/week  PT DURATION: 8 weeks  first 6 pool only then remainder alternating land and water for optimal outcome  PLANNED INTERVENTIONS: [id number]- PT Re-evaluation, 97110-Therapeutic exercises, 97530- Therapeutic activity, 97112- Neuromuscular re-education, 97535- Self Care, [id number]- Manual therapy, [id number]- Gait training, [phone]- Orthotic Initial, [phone]- Aquatic Therapy, [phone]- Ionotophoresis 4mg /ml Dexamethasone , Patient/Family education, Balance training,  Stair training, Taping, Dry Needling, Joint mobilization, DME instructions, Cryotherapy, and Moist heat.  PLAN FOR NEXT SESSION: Land/aquatic: le and core strengthening, gait training, balance retraining, transitional movements/STS transfers; activity toleration. Use of AD as approp  Ronal Kem) Akhilesh Sassone MPT 09/18/23 1:04 PM [facility] [address] [place], [place], [address] Phone: [phone]   Fax:  [phone]    Date of referral: 06/19/23 Referring provider: [facility] Referring diagnosis? LBP Treatment diagnosis? (if different than referring diagnosis) LBP and Knee pain  What was this (referring dx) caused by? Ongoing Issue and Arthritis  Lysle of Condition: Chronic (continuous duration > 3 months)   Laterality: Both  Current Functional Measure Score: Other ODI 44%  Objective measurements identify impairments when they are compared to normal values, the uninvolved extremity, and prior level of function.  [x]  Yes  []  No  Objective assessment of functional ability: Moderate functional limitations   Briefly describe symptoms: Pain lb radicularr pattern into lle; right knee pain  How did symptoms start: chronic  Average pain intensity:  Last 24 hours: 7/10  Past week: 7/10  How often does the pt experience symptoms? Constantly  How much have the symptoms interfered with usual daily activities? Quite a bit  How has condition changed since care began at this facility? NA - initial visit  In general, how is the patients overall health? Good   BACK PAIN (STarT Back Screening Tool) Has pain spread down the leg(s) at some time in the last 2 weeks? yes Has the pt only walked short distances because of back pain? yes Does patient think it's not safe for a person with this condition to be physically active? yes Does patient feel back pain is terrible and will never get any better? yes Has patient stopped enjoying  things they usually enjoy? yes

## 2023-09-23 ENCOUNTER — Ambulatory Visit (HOSPITAL_BASED_OUTPATIENT_CLINIC_OR_DEPARTMENT_OTHER): Admitting: Physical Therapy

## 2023-09-23 ENCOUNTER — Encounter (HOSPITAL_BASED_OUTPATIENT_CLINIC_OR_DEPARTMENT_OTHER): Payer: Self-pay | Admitting: Physical Therapy

## 2023-09-23 DIAGNOSIS — M6281 Muscle weakness (generalized): Secondary | ICD-10-CM

## 2023-09-23 DIAGNOSIS — M5459 Other low back pain: Secondary | ICD-10-CM

## 2023-09-23 DIAGNOSIS — R2681 Unsteadiness on feet: Secondary | ICD-10-CM

## 2023-09-23 DIAGNOSIS — G8929 Other chronic pain: Secondary | ICD-10-CM

## 2023-09-23 NOTE — Therapy (Signed)
 OUTPATIENT PHYSICAL THERAPY THORACOLUMBAR TREATMENT   Patient Name: Autumn Paul MRN: [id number] DOB:1945-12-30, 78 y.o., female Today's Date: 09/23/2023 Progress Note   Reporting Period 07/30/23 to 09/23/23   See note below for Objective Data and Assessment of Progress/Goals  END OF SESSION:  PT End of Session - 09/23/23 0947     Visit Number 10    Number of Visits 28    Date for PT Re-Evaluation 09/26/23    Authorization Type [employer]    Authorization Time Period 8 visits approved   09/09/23-11/04/23    Authorization - Visit Number 2    Authorization - Number of Visits 8    Progress Note Due on Visit 20    PT Start Time 0945    PT Stop Time 1030    PT Time Calculation (min) 45 min    Activity Tolerance Patient tolerated treatment well    Behavior During Therapy WFL for tasks assessed/performed            Past Medical History:  Diagnosis Date   Fibromyalgia    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    uses Timolol   Hyperlipidemia    Osteoporosis    Spinal stenosis    Past Surgical History:  Procedure Laterality Date   CARPAL TUNNEL RELEASE     right wrist   CATARACT EXTRACTION     COLONOSCOPY     RHINOPLASTY     TUBAL LIGATION     Patient Active Problem List   Diagnosis Date Noted   After cataract of left eye not obscuring vision 07/26/2014   Glaucoma suspect, both eyes 08/02/2013   Conjunctivochalasis 04/07/2012   Dermatochalasis 04/07/2012   Dry eyes 04/07/2012   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   Meibomian gland dysfunction 04/07/2012   Myopia with astigmatism and presbyopia 04/07/2012   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   Posterior vitreous detachment 04/07/2012   Vitreous syneresis 04/07/2012    PCP: Elsie Gentry MD  REFERRING PROVIDER:   Beuford Anes, MD    REFERRING DIAG: M54.50 (ICD-10-CM) - Low back pain, unspecified   Rationale for Evaluation and Treatment: Rehabilitation  THERAPY DIAG:  Other low back  pain  Muscle weakness (generalized)  Chronic pain of right knee  Unsteadiness on feet  ONSET DATE: exacerbating for last 6 months  SUBJECTIVE:                                                                                                                                                                                           SUBJECTIVE STATEMENT: Pt reports getting injection today in R knee. Feels that she is  moving better without it aggravating symptoms so much. Patient states 50% improvement/functional status. L side is doing well. R knee just killing her.   POOL ACCESS: considering joining [employer].   From evaluation:  I have had back problems for years.  Have bilateral knee pain need an OA.  Saw Dr Beuford for my back and he said I was not a surgical candidate.  I also saw Dr Burnetta but didn't really think he did much.  I do have an appt with a nuero guy Dr Joshua next week.  Not sure which one is making the other worse knee vs back.  Had knee injections  which help some but the back injection didn't help.  Stopped with exercising 4 x a week back 6 months ago due to back pain  PERTINENT HISTORY:  history of hyperlipidemia glaucoma, fibromyalgia, spinal stenosis. With recent spinal injection.  Went to ER 5/4 for LBP with sciatica  PAIN:  Are you having pain? Yes: NPRS scale: 7/10 R knee,  2/10 lower back  Pain location: see above  Pain description: achy Aggravating factors: standing/weight bearing; working in garden 30 min Relieving factors: sitting, oxycodone     PRECAUTIONS: None  RED FLAGS: None   WEIGHT BEARING RESTRICTIONS: No  FALLS:  Has patient fallen in last 6 months? No  LIVING ENVIRONMENT: Lives with: lives with their spouse Lives in: House/apartment  OCCUPATION: retired  PLOF: Independent  PATIENT GOALS: decrease pain, walk longer  NEXT MD VISIT: Dr Joshua Next week  OBJECTIVE:  Note: Objective measures were completed at Evaluation unless  otherwise noted.  DIAGNOSTIC FINDINGS:  X-ray hip MPRESSION: 1. No acute findings. 2. Lumbar spondylosis.  PATIENT SURVEYS:  22/50=44% 09/23/23: 24%  COGNITION: Overall cognitive status: Within functional limits for tasks assessed      MUSCLE LENGTH: Hamstrings: tested in sitting tight R>L   POSTURE: rounded shoulders, forward head, decreased lumbar lordosis, and weight shift left  PALPATION: TTP throughout lower thoracic and lumbar pain left glut. Crepitus bilat patella  LUMBAR ROM:   AROM eval  Flexion full  Extension Neutral P!  Right lateral flexion Full P!  Left lateral flexion Full P!  Right rotation   Left rotation    (Blank rows = not tested)  LOWER EXTREMITY ROM:     wfl  LOWER EXTREMITY MMT:    HD (lbs) Tested in sitting Right eval Left eval Right 09/23/23 Left 09/23/23  Hip flexion 32.7 33.2 42.3 43.5  Hip extension      Hip abduction 23.0 21.2 45.8 43  Hip adduction      Hip internal rotation      Hip external rotation      Knee flexion      Knee extension 34.2 22.5 38.1 46  Ankle dorsiflexion      Ankle plantarflexion      Ankle inversion      Ankle eversion       (Blank rows = not tested)  LUMBAR SPECIAL TESTS:  Slump test: Negative  FUNCTIONAL TESTS:  5 times sit to stand: 20.96 Timed up and go (TUG): 19.04  BERG Balance Test          Date: eval  Sit to Stand 3 09/23/23: 3  Standing unsupported 4 4  Sitting with back unsupported but feet supported 4 4  Stand to sit  2 4  Transfers  3 3  Standing unsupported with eyes closed 2 4  Standing unsupported feet together 3 4  From standing position,  reach forward with outstretched arm 3 3  From standing position, pick up object from floor 3 3  From standing position, turn and look behind over each shoulder 2 3  Turn 360 2 3  Standing unsupported, alternately place foot on step 1 3  Standing unsupported, one foot in front 0 0  Standing on one leg 0 0  Total:  32/56 41/56      GAIT: Distance walked: 400 ft Assistive device utilized: None Level of assistance: Complete Independence Comments: Increased knee flex/step height (march like), trunk guarding and antalgic limp through LLE  TREATMENT                                                                                                                         09/23/23 Nustep 5 minutes level 5 Reassessment STS 1 x 10    09/18/23 Nustep 4 minutes level 5 Standing hip abduction RTB at knees 2 x 10 Standing hip extension RTB at knees 2 x 10 Supine SLR 2 x 10 PPT x 5 PPT with knee ext/flex; SLR Tandem stance 3 x 30 second holds     [facility] Adult PT Treatment:                                             09/12/23 Pt seen for aquatic therapy today.  Treatment took place in water 3.5-4.75 ft in depth at the [facility] pool. Temp of water was 91.  Pt entered/exited the pool via stairs independently with bilat rail.  - unsupported walking forward/ backward - cues for even step length - side stepping without support -> with arm addct / abdct with rainbow hand floats  - suitcase carry marching backward/forward with single/ bilat rainbow hand floats at side, multiple reps - UE on rainbow hand floats: 3 way LE kick (toe tap when in ext) x 5 each LE   - high knee marching forward/ backward with alternating row with hand floats - TrA set in staggered stance with full hollow noodle pull down to thighs x 10 each, cues to slow speed  - tandem gait forward/ backward with UE on noodle, cues to slow spped - straddling noodle and UE support on corner: cycling, hip abdct/addct  - standing hamstring stretch with heel on 3rd step x 15s  -bilat gastroc stretch with heels off of step x 20s, cues for posture and technique  09/09/23 Standing hip abduction RTB at knees 2 x 10 Standing hip extension RTB at knees 2 x 10 Step up 4 inch 2 x 10 Tandem stance 3 x 30 second holds STS with 2 airex 2 x 10  Standing palof press  RTB 2 x 10 Nustep 2 minutes level 5  09/02/23 Standing hip abduction 2 x 10 Standing hip extension 2 x 10 Step up 4 inch 2 x 10 Standing row GTB 2  x 10 Standing shoulder extension GTB 2 x 10 LAQ 1 x 10 with 5 second holds Mini squat 1 x 10 - difficulty with mechanics STS with 2 airex 2 x 10    [facility] Adult PT Treatment:                                             09/01/23 Pt seen for aquatic therapy today.  Treatment took place in water 3.5-4.75 ft in depth at the [facility] pool. Temp of water was 91.  Pt entered/exited the pool via stairs independently with bilat rail.  - unsupported walking forward/ backward - cues for even step length - suitcase carry marching backward/forward with single yellow hand floats at side-> bilat rainbow hand floats - tandem gait forward/ backward with UE on yellow hand floats, cues to slow spped - UE on yellow hand floats:  LE swings into hip abdct/ addct 2 x 10 (cues to control height of leg), hip flex/ext x 10 each LE - in 62ft6: SLS x 15sec x 2 reps LLE, 1 RLE (very difficult on LLE, hops to steady) - walking forward for rest and recovery  - arms crossed at chest: 3 way toe tap x 10 each LE - grapevine R/L x 2 - squats pushing single rainbow -> yellow hand float under water x 15 - seated on bench in water: cycling, hip abdct/ addct; alternating LAQ; cycling (some R hip pain) - standing hamstring stretch with heel on 3rd step x 15s  -figure 4 stretch ue support on hand rails    Pt requires the buoyancy and hydrostatic pressure of water for support, and to offload joints by unweighting joint load by at least 50 % in navel deep water and by at least 75-80% in chest to neck deep water.  Viscosity of the water is needed for resistance of strengthening. Water current perturbations provides challenge to standing balance requiring increased core activation.      PATIENT EDUCATION:  Education details: reacquainting with aquatic therapy, 09/02/23:  HEP  Person educated: Patient Education method: Explanation and Handouts Education comprehension: verbalized understanding  HOME EXERCISE PROGRAM: Access Code: [email or web address] URL: [email or web address] Date: 09/02/2023 Prepared by: Prentice Janari Yamada  Exercises - Standing Hip Abduction with Counter Support  - 1 x daily - 7 x weekly - 2 sets - 10 reps - Standing Hip Extension with Counter Support  - 1 x daily - 7 x weekly - 2 sets - 10 reps - Seated Long Arc Quad  - 1 x daily - 7 x weekly - 1-2 sets - 10 reps - 5 second hold  ASSESSMENT:  CLINICAL IMPRESSION: Patient has met 1/4 short term goals and 3/5 long term goals with ability to complete HEP and improvement in symptoms, strength, activity tolerance, gait, balance, and functional mobility. Remaining goals not met due to continued deficits in symptoms, activity tolerance, functional mobility, balance. Patient has made good progress toward remaining goals. Extending POC 1-2x/week for 6 more weeks. Patient getting injection today which will hopefully allow for progressive functional mobility and strengthening. Patient will continue to benefit from skilled physical therapy in order to improve function and reduce impairment.       From initial evaluation:  Patient is a 78 y.o. f who was seen today for physical therapy evaluation and treatment for LBP.  Pt presents with gait deviation due to pain  in bilateral knees (R>L) and LBP left lumbar area with radicular pattern into left leg to knee.  She demonstrates a strength deficit and is a high fall risk as per Lars.  She has been told she is not a surgical candidate for back but is for a TKR which she has been delaying in attempts to avoid. She has seen many Mds of the same specialty trying to get a consensus of forward treatment on LB but has not had any success other than being referred over to aquatic PT. She does have an appt to see her neurologist next week. She is a good candidate  for skill PT intervention both aquatic and land to progress her toward improvement in functional mobility and Adl's as well as decreasing fall risk.   OBJECTIVE IMPAIRMENTS: Abnormal gait, decreased activity tolerance, decreased balance, decreased knowledge of use of DME, decreased mobility, decreased ROM, decreased strength, and pain.   ACTIVITY LIMITATIONS: lifting, bending, squatting, sleeping, stairs, transfers, and locomotion level  PARTICIPATION LIMITATIONS: meal prep, cleaning, shopping, community activity, and yard work  PERSONAL FACTORS: Time since onset of injury/illness/exacerbation and 1-2 comorbidities: see PmHx are also affecting patient's functional outcome.   REHAB POTENTIAL: Good  CLINICAL DECISION MAKING: Evolving/moderate complexity  EVALUATION COMPLEXITY: Moderate   GOALS: Goals reviewed with patient? Yes  SHORT TERM GOALS: Target date: 08/21/23  Pt will tolerate full aquatic sessions consistently without increase in pain and with improving function to demonstrate good toleration and effectiveness of intervention.  Baseline: Goal status: MET - 08/29/23  2.  Pt will return to regular exercise weekly Baseline:  Goal status: In progress 09/18/23  3.  Pt will tolerate stair climbing using  combination of alternating and step to pattern ascending and descending 6 steps without use of handrail  Baseline:  Goal status: In progress 09/18/23  4.  Pt will report good toleration to gardening without excessive pain Baseline:  Goal status: in progress 09/18/23    LONG TERM GOALS: Target date: 09/26/23  Pt to improve on ODI by 13-15% to demonstrate statistically significant Improvement in function. Baseline: 22/50=44% Goal status: MET  2.  Pt will be indep with final HEP's (land and aquatic as appropriate) for continued management of condition Baseline:  Goal status: INITIAL  3.  Pt will improve strength in hips by at least 5 lbs to demonstrate improved overall physical  function Baseline:  Goal status: MET  4.  Pt will improve on Berg balance test to >/= 45/56 to demonstrate a decrease in fall risk. Baseline:32/56  09/23/23: 41/56 Goal status: INITIAL  5.  Pt will report decrease in pain by at least 50% for improved toleration to activity/quality of life and to demonstrate improved management of pain. Baseline: see chart Goal status: MET  PLAN:  PT FREQUENCY: 2x/week  PT DURATION: 6 weeks  PLANNED INTERVENTIONS: [id number]- PT Re-evaluation, 97110-Therapeutic exercises, 97530- Therapeutic activity, 97112- Neuromuscular re-education, 97535- Self Care, [id number]- Manual therapy, [phone]- Gait training, [phone]- Orthotic Initial, [phone]- Aquatic Therapy, [phone]- Ionotophoresis 4mg /ml Dexamethasone , Patient/Family education, Balance training, Stair training, Taping, Dry Needling, Joint mobilization, DME instructions, Cryotherapy, and Moist heat.  PLAN FOR NEXT SESSION: Land/aquatic: le and core strengthening, gait training, balance retraining, transitional movements/STS transfers; activity toleration. Use of AD as approp   Prentice GORMAN Stains, PT, DPT 09/23/2023, 10:34 AM  [facility] [address] [place], [place], [address] Phone: [phone]   Fax:  [phone]    Date of referral: 06/19/23 Referring provider: Beuford  Referring diagnosis? LBP Treatment diagnosis? (if different than referring diagnosis) LBP and Knee pain  What was this (referring dx) caused by? Ongoing Issue and Arthritis  Lysle of Condition: Chronic (continuous duration > 3 months)   Laterality: Both  Current Functional Measure Score: Other ODI 44%  Objective measurements identify impairments when they are compared to normal values, the uninvolved extremity, and prior level of function.  [x]  Yes  []  No  Objective assessment of functional ability: Moderate functional limitations   Briefly describe symptoms: Pain lb radicularr pattern  into lle; right knee pain  How did symptoms start: chronic  Average pain intensity:  Last 24 hours: 7/10  Past week: 7/10  How often does the pt experience symptoms? Constantly  How much have the symptoms interfered with usual daily activities? Quite a bit  How has condition changed since care began at this facility? NA - initial visit  In general, how is the patients overall health? Good   BACK PAIN (STarT Back Screening Tool) Has pain spread down the leg(s) at some time in the last 2 weeks? yes Has the pt only walked short distances because of back pain? yes Does patient think it's not safe for a person with this condition to be physically active? yes Does patient feel back pain is terrible and will never get any better? yes Has patient stopped enjoying things they usually enjoy? yes

## 2023-09-26 ENCOUNTER — Ambulatory Visit (HOSPITAL_BASED_OUTPATIENT_CLINIC_OR_DEPARTMENT_OTHER): Admitting: Physical Therapy

## 2023-09-29 ENCOUNTER — Encounter (HOSPITAL_BASED_OUTPATIENT_CLINIC_OR_DEPARTMENT_OTHER): Payer: Self-pay | Admitting: Physical Therapy

## 2023-09-29 ENCOUNTER — Ambulatory Visit (HOSPITAL_BASED_OUTPATIENT_CLINIC_OR_DEPARTMENT_OTHER): Admitting: Physical Therapy

## 2023-09-29 DIAGNOSIS — G8929 Other chronic pain: Secondary | ICD-10-CM

## 2023-09-29 DIAGNOSIS — M5459 Other low back pain: Secondary | ICD-10-CM

## 2023-09-29 DIAGNOSIS — R2681 Unsteadiness on feet: Secondary | ICD-10-CM

## 2023-09-29 DIAGNOSIS — M6281 Muscle weakness (generalized): Secondary | ICD-10-CM

## 2023-09-29 NOTE — Therapy (Signed)
 OUTPATIENT PHYSICAL THERAPY THORACOLUMBAR TREATMENT   Patient Name: Autumn Paul MRN: [id number] DOB:1946-01-11, 78 y.o., female Today's Date: 09/29/2023   END OF SESSION:  PT End of Session - 09/29/23 1026     Visit Number 11    Number of Visits 28    Date for PT Re-Evaluation 11/04/23    Authorization Type [employer]    Authorization Time Period 8 visits approved   09/09/23-11/04/23    Authorization - Visit Number 3    Authorization - Number of Visits 8    Progress Note Due on Visit 20    PT Start Time 1020    PT Stop Time 1100    PT Time Calculation (min) 40 min    Activity Tolerance Patient tolerated treatment well    Behavior During Therapy WFL for tasks assessed/performed             Past Medical History:  Diagnosis Date   Fibromyalgia    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    uses Timolol   Hyperlipidemia    Osteoporosis    Spinal stenosis    Past Surgical History:  Procedure Laterality Date   CARPAL TUNNEL RELEASE     right wrist   CATARACT EXTRACTION     COLONOSCOPY     RHINOPLASTY     TUBAL LIGATION     Patient Active Problem List   Diagnosis Date Noted   After cataract of left eye not obscuring vision 07/26/2014   Glaucoma suspect, both eyes 08/02/2013   Conjunctivochalasis 04/07/2012   Dermatochalasis 04/07/2012   Dry eyes 04/07/2012   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   Meibomian gland dysfunction 04/07/2012   Myopia with astigmatism and presbyopia 04/07/2012   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   Posterior vitreous detachment 04/07/2012   Vitreous syneresis 04/07/2012    PCP: Elsie Gentry MD  REFERRING PROVIDER:   Beuford Anes, MD    REFERRING DIAG: M54.50 (ICD-10-CM) - Low back pain, unspecified   Rationale for Evaluation and Treatment: Rehabilitation  THERAPY DIAG:  Other low back pain  Muscle weakness (generalized)  Chronic pain of right knee  Unsteadiness on feet  ONSET DATE: exacerbating  for last 6 months  SUBJECTIVE:                                                                                                                                                                                           SUBJECTIVE STATEMENT: Pt reports not great response to injection, was hoping for more relief  POOL ACCESS: considering joining [employer].   From evaluation:  I have had back problems for years.  Have bilateral knee pain need an OA.  Saw Dr Beuford for my back and he said I was not a surgical candidate.  I also saw Dr Burnetta but didn't really think he did much.  I do have an appt with a nuero guy Dr Joshua next week.  Not sure which one is making the other worse knee vs back.  Had knee injections  which help some but the back injection didn't help.  Stopped with exercising 4 x a week back 6 months ago due to back pain  PERTINENT HISTORY:  history of hyperlipidemia glaucoma, fibromyalgia, spinal stenosis. With recent spinal injection.  Went to ER 5/4 for LBP with sciatica  PAIN:  Are you having pain? Yes: NPRS scale: 7/10 R knee,  2/10 lower back  Pain location: see above  Pain description: achy Aggravating factors: standing/weight bearing; working in garden 30 min Relieving factors: sitting, oxycodone     PRECAUTIONS: None  RED FLAGS: None   WEIGHT BEARING RESTRICTIONS: No  FALLS:  Has patient fallen in last 6 months? No  LIVING ENVIRONMENT: Lives with: lives with their spouse Lives in: House/apartment  OCCUPATION: retired  PLOF: Independent  PATIENT GOALS: decrease pain, walk longer  NEXT MD VISIT: Dr Joshua Next week  OBJECTIVE:  Note: Objective measures were completed at Evaluation unless otherwise noted.  DIAGNOSTIC FINDINGS:  X-ray hip MPRESSION: 1. No acute findings. 2. Lumbar spondylosis.  PATIENT SURVEYS:  22/50=44% 09/23/23: 24%  COGNITION: Overall cognitive status: Within functional limits for tasks assessed      MUSCLE  LENGTH: Hamstrings: tested in sitting tight R>L   POSTURE: rounded shoulders, forward head, decreased lumbar lordosis, and weight shift left  PALPATION: TTP throughout lower thoracic and lumbar pain left glut. Crepitus bilat patella  LUMBAR ROM:   AROM eval  Flexion full  Extension Neutral P!  Right lateral flexion Full P!  Left lateral flexion Full P!  Right rotation   Left rotation    (Blank rows = not tested)  LOWER EXTREMITY ROM:     wfl  LOWER EXTREMITY MMT:    HD (lbs) Tested in sitting Right eval Left eval Right 09/23/23 Left 09/23/23  Hip flexion 32.7 33.2 42.3 43.5  Hip extension      Hip abduction 23.0 21.2 45.8 43  Hip adduction      Hip internal rotation      Hip external rotation      Knee flexion      Knee extension 34.2 22.5 38.1 46  Ankle dorsiflexion      Ankle plantarflexion      Ankle inversion      Ankle eversion       (Blank rows = not tested)  LUMBAR SPECIAL TESTS:  Slump test: Negative  FUNCTIONAL TESTS:  5 times sit to stand: 20.96 Timed up and go (TUG): 19.04  BERG Balance Test          Date: eval  Sit to Stand 3 09/23/23: 3  Standing unsupported 4 4  Sitting with back unsupported but feet supported 4 4  Stand to sit  2 4  Transfers  3 3  Standing unsupported with eyes closed 2 4  Standing unsupported feet together 3 4  From standing position, reach forward with outstretched arm 3 3  From standing position, pick up object from floor 3 3  From standing position, turn and look behind over each shoulder 2 3  Turn 360 2 3  Standing unsupported, alternately place foot on  step 1 3  Standing unsupported, one foot in front 0 0  Standing on one leg 0 0  Total:  32/56 41/56     GAIT: Distance walked: 400 ft Assistive device utilized: None Level of assistance: Complete Independence Comments: Increased knee flex/step height (march like), trunk guarding and antalgic limp through LLE  TREATMENT                                                                                                                          [facility] Adult PT Treatment:                                             09/29/23 Pt seen for aquatic therapy today.  Treatment took place in water 3.5-4.75 ft in depth at the [facility] pool. Temp of water was 91.  Pt entered/exited the pool via stairs independently with bilat rail.  - unsupported walking forward/ backward - cues for even step length - side stepping without support -> with arm addct / abdct with rainbow hand floats  -tandem walking forward and back ue support RBHB x 4 widths (good challenge) - UE on rainbow hand floats: leg swings 2 x 5; 3 way LE kick x 8 each LE   - suitcase carry marching backward/forward with single/ bilat rainbow hand floats at side, multiple reps - TrA set in staggered stance then wide stance with solid noodle pull down to thighs x 10 each, cues to slow speed  -stair climbing negotiation out then into pool -bilat gastroc stretch with heels off of step x 20s, cues for posture and technique - standing hamstring stretch with heel on 3rd step x 15s     09/23/23 Nustep 5 minutes level 5 Reassessment STS 1 x 10    09/18/23 Nustep 4 minutes level 5 Standing hip abduction RTB at knees 2 x 10 Standing hip extension RTB at knees 2 x 10 Supine SLR 2 x 10 PPT x 5 PPT with knee ext/flex; SLR Tandem stance 3 x 30 second holds     [facility] Adult PT Treatment:                                             09/12/23 Pt seen for aquatic therapy today.  Treatment took place in water 3.5-4.75 ft in depth at the [facility] pool. Temp of water was 91.  Pt entered/exited the pool via stairs independently with bilat rail.  - unsupported walking forward/ backward - cues for even step length - side stepping without support -> with arm addct / abdct with rainbow hand floats  - suitcase carry marching backward/forward with single/ bilat rainbow hand floats at side, multiple reps -  UE on rainbow hand  floats: 3 way LE kick (toe tap when in ext) x 5 each LE   - high knee marching forward/ backward with alternating row with hand floats - TrA set in staggered stance with full hollow noodle pull down to thighs x 10 each, cues to slow speed  - tandem gait forward/ backward with UE on noodle, cues to slow spped - straddling noodle and UE support on corner: cycling, hip abdct/addct  - standing hamstring stretch with heel on 3rd step x 15s  -bilat gastroc stretch with heels off of step x 20s, cues for posture and technique  09/09/23 Standing hip abduction RTB at knees 2 x 10 Standing hip extension RTB at knees 2 x 10 Step up 4 inch 2 x 10 Tandem stance 3 x 30 second holds STS with 2 airex 2 x 10  Standing palof press RTB 2 x 10 Nustep 2 minutes level 5  09/02/23 Standing hip abduction 2 x 10 Standing hip extension 2 x 10 Step up 4 inch 2 x 10 Standing row GTB 2 x 10 Standing shoulder extension GTB 2 x 10 LAQ 1 x 10 with 5 second holds Mini squat 1 x 10 - difficulty with mechanics STS with 2 airex 2 x 10    [facility] Adult PT Treatment:                                             09/01/23 Pt seen for aquatic therapy today.  Treatment took place in water 3.5-4.75 ft in depth at the [facility] pool. Temp of water was 91.  Pt entered/exited the pool via stairs independently with bilat rail.  - unsupported walking forward/ backward - cues for even step length - suitcase carry marching backward/forward with single yellow hand floats at side-> bilat rainbow hand floats - tandem gait forward/ backward with UE on yellow hand floats, cues to slow spped - UE on yellow hand floats:  LE swings into hip abdct/ addct 2 x 10 (cues to control height of leg), hip flex/ext x 10 each LE - in 53ft6: SLS x 15sec x 2 reps LLE, 1 RLE (very difficult on LLE, hops to steady) - walking forward for rest and recovery  - arms crossed at chest: 3 way toe tap x 10 each LE - grapevine R/L x  2 - squats pushing single rainbow -> yellow hand float under water x 15 - seated on bench in water: cycling, hip abdct/ addct; alternating LAQ; cycling (some R hip pain) - standing hamstring stretch with heel on 3rd step x 15s  -figure 4 stretch ue support on hand rails    Pt requires the buoyancy and hydrostatic pressure of water for support, and to offload joints by unweighting joint load by at least 50 % in navel deep water and by at least 75-80% in chest to neck deep water.  Viscosity of the water is needed for resistance of strengthening. Water current perturbations provides challenge to standing balance requiring increased core activation.      PATIENT EDUCATION:  Education details: reacquainting with aquatic therapy, 09/02/23: HEP  Person educated: Patient Education method: Explanation and Handouts Education comprehension: verbalized understanding  HOME EXERCISE PROGRAM: Access Code: [email or web address] URL: [email or web address] Date: 09/02/2023 Prepared by: Prentice Zaunegger  Exercises - Standing Hip Abduction with Counter Support  - 1 x daily - 7 x weekly - 2 sets -  10 reps - Standing Hip Extension with Counter Support  - 1 x daily - 7 x weekly - 2 sets - 10 reps - Seated Long Arc Quad  - 1 x daily - 7 x weekly - 1-2 sets - 10 reps - 5 second hold  ASSESSMENT:  CLINICAL IMPRESSION: Pt demonstrating improvement in balance with 3 way kick without LOB and good control 70% submerged, challenges is increased with reducing submersion. Stair climbing continues to be difficult unsupported due to knee pain as she continues to use hand rails for safety and to offload weight.  She reports receiving a Cortizone injection end of last week which reduced pain little (7/10->3/10).  She states she will not have anymore going forward and is now considering a TKR.   PN/re-cert Patient has met 1/4 short term goals and 3/5 long term goals with ability to complete HEP and improvement in  symptoms, strength, activity tolerance, gait, balance, and functional mobility. Remaining goals not met due to continued deficits in symptoms, activity tolerance, functional mobility, balance. Patient has made good progress toward remaining goals. Extending POC 1-2x/week for 6 more weeks. Patient getting injection today which will hopefully allow for progressive functional mobility and strengthening. Patient will continue to benefit from skilled physical therapy in order to improve function and reduce impairment.       From initial evaluation:  Patient is a 78 y.o. f who was seen today for physical therapy evaluation and treatment for LBP.  Pt presents with gait deviation due to pain in bilateral knees (R>L) and LBP left lumbar area with radicular pattern into left leg to knee.  She demonstrates a strength deficit and is a high fall risk as per Lars.  She has been told she is not a surgical candidate for back but is for a TKR which she has been delaying in attempts to avoid. She has seen many Mds of the same specialty trying to get a consensus of forward treatment on LB but has not had any success other than being referred over to aquatic PT. She does have an appt to see her neurologist next week. She is a good candidate for skill PT intervention both aquatic and land to progress her toward improvement in functional mobility and Adl's as well as decreasing fall risk.   OBJECTIVE IMPAIRMENTS: Abnormal gait, decreased activity tolerance, decreased balance, decreased knowledge of use of DME, decreased mobility, decreased ROM, decreased strength, and pain.   ACTIVITY LIMITATIONS: lifting, bending, squatting, sleeping, stairs, transfers, and locomotion level  PARTICIPATION LIMITATIONS: meal prep, cleaning, shopping, community activity, and yard work  PERSONAL FACTORS: Time since onset of injury/illness/exacerbation and 1-2 comorbidities: see PmHx are also affecting patient's functional outcome.   REHAB  POTENTIAL: Good  CLINICAL DECISION MAKING: Evolving/moderate complexity  EVALUATION COMPLEXITY: Moderate   GOALS: Goals reviewed with patient? Yes  SHORT TERM GOALS: Target date: 08/21/23  Pt will tolerate full aquatic sessions consistently without increase in pain and with improving function to demonstrate good toleration and effectiveness of intervention.  Baseline: Goal status: MET - 08/29/23  2.  Pt will return to regular exercise weekly Baseline:  Goal status: In progress 09/18/23  3.  Pt will tolerate stair climbing using  combination of alternating and step to pattern ascending and descending 6 steps without use of handrail  Baseline:  Goal status: In progress 09/18/23  4.  Pt will report good toleration to gardening without excessive pain Baseline:  Goal status: in progress 09/18/23    LONG  TERM GOALS: Target date: 09/26/23  Pt to improve on ODI by 13-15% to demonstrate statistically significant Improvement in function. Baseline: 22/50=44% Goal status: MET  2.  Pt will be indep with final HEP's (land and aquatic as appropriate) for continued management of condition Baseline:  Goal status: INITIAL  3.  Pt will improve strength in hips by at least 5 lbs to demonstrate improved overall physical function Baseline:  Goal status: MET  4.  Pt will improve on Berg balance test to >/= 45/56 to demonstrate a decrease in fall risk. Baseline:32/56  09/23/23: 41/56 Goal status: INITIAL  5.  Pt will report decrease in pain by at least 50% for improved toleration to activity/quality of life and to demonstrate improved management of pain. Baseline: see chart Goal status: MET  PLAN:  PT FREQUENCY: 2x/week  PT DURATION: 6 weeks  PLANNED INTERVENTIONS: [id number]- PT Re-evaluation, 97110-Therapeutic exercises, 97530- Therapeutic activity, 97112- Neuromuscular re-education, 97535- Self Care, [id number]- Manual therapy, [phone]- Gait training, [phone]- Orthotic Initial, [phone]- Aquatic Therapy,  [phone]- Ionotophoresis 4mg /ml Dexamethasone , Patient/Family education, Balance training, Stair training, Taping, Dry Needling, Joint mobilization, DME instructions, Cryotherapy, and Moist heat.  PLAN FOR NEXT SESSION: Land/aquatic: le and core strengthening, gait training, balance retraining, transitional movements/STS transfers; activity toleration. Use of AD as approp   Ronal Kem) Madisin Hasan MPT 09/29/23 11:06 AM [facility] [address] [place], [place], [address] Phone: [phone]   Fax:  [phone]    Date of referral: 06/19/23 Referring provider: [facility] Referring diagnosis? LBP Treatment diagnosis? (if different than referring diagnosis) LBP and Knee pain  What was this (referring dx) caused by? Ongoing Issue and Arthritis  Lysle of Condition: Chronic (continuous duration > 3 months)   Laterality: Both  Current Functional Measure Score: Other ODI 44%  Objective measurements identify impairments when they are compared to normal values, the uninvolved extremity, and prior level of function.  [x]  Yes  []  No  Objective assessment of functional ability: Moderate functional limitations   Briefly describe symptoms: Pain lb radicularr pattern into lle; right knee pain  How did symptoms start: chronic  Average pain intensity:  Last 24 hours: 7/10  Past week: 7/10  How often does the pt experience symptoms? Constantly  How much have the symptoms interfered with usual daily activities? Quite a bit  How has condition changed since care began at this facility? NA - initial visit  In general, how is the patients overall health? Good   BACK PAIN (STarT Back Screening Tool) Has pain spread down the leg(s) at some time in the last 2 weeks? yes Has the pt only walked short distances because of back pain? yes Does patient think it's not safe for a person with this condition to be physically active? yes Does patient feel  back pain is terrible and will never get any better? yes Has patient stopped enjoying things they usually enjoy? yes

## 2023-09-30 ENCOUNTER — Ambulatory Visit (HOSPITAL_BASED_OUTPATIENT_CLINIC_OR_DEPARTMENT_OTHER): Payer: Self-pay | Admitting: Physical Therapy

## 2023-09-30 ENCOUNTER — Encounter (HOSPITAL_BASED_OUTPATIENT_CLINIC_OR_DEPARTMENT_OTHER): Payer: Self-pay | Admitting: Physical Therapy

## 2023-09-30 DIAGNOSIS — M5459 Other low back pain: Secondary | ICD-10-CM | POA: Diagnosis not present

## 2023-09-30 DIAGNOSIS — R2681 Unsteadiness on feet: Secondary | ICD-10-CM

## 2023-09-30 DIAGNOSIS — G8929 Other chronic pain: Secondary | ICD-10-CM

## 2023-09-30 DIAGNOSIS — M6281 Muscle weakness (generalized): Secondary | ICD-10-CM

## 2023-09-30 NOTE — Therapy (Signed)
 OUTPATIENT PHYSICAL THERAPY THORACOLUMBAR TREATMENT   Patient Name: Autumn Paul MRN: [id number] DOB:05-12-1945, 78 y.o., female Today's Date: 09/30/2023   END OF SESSION:  PT End of Session - 09/30/23 0936     Visit Number 12    Number of Visits 28    Date for PT Re-Evaluation 11/04/23    Authorization Type [employer]    Authorization Time Period 8 visits approved   09/09/23-11/04/23    Authorization - Visit Number 4    Authorization - Number of Visits 8    Progress Note Due on Visit 20    PT Start Time 0935    PT Stop Time 1015    PT Time Calculation (min) 40 min    Activity Tolerance Patient tolerated treatment well    Behavior During Therapy WFL for tasks assessed/performed             Past Medical History:  Diagnosis Date   Fibromyalgia    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    uses Timolol   Hyperlipidemia    Osteoporosis    Spinal stenosis    Past Surgical History:  Procedure Laterality Date   CARPAL TUNNEL RELEASE     right wrist   CATARACT EXTRACTION     COLONOSCOPY     RHINOPLASTY     TUBAL LIGATION     Patient Active Problem List   Diagnosis Date Noted   After cataract of left eye not obscuring vision 07/26/2014   Glaucoma suspect, both eyes 08/02/2013   Conjunctivochalasis 04/07/2012   Dermatochalasis 04/07/2012   Dry eyes 04/07/2012   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   Meibomian gland dysfunction 04/07/2012   Myopia with astigmatism and presbyopia 04/07/2012   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   Posterior vitreous detachment 04/07/2012   Vitreous syneresis 04/07/2012    PCP: Elsie Gentry MD  REFERRING PROVIDER:   Beuford Anes, MD    REFERRING DIAG: M54.50 (ICD-10-CM) - Low back pain, unspecified   Rationale for Evaluation and Treatment: Rehabilitation  THERAPY DIAG:  Other low back pain  Muscle weakness (generalized)  Chronic pain of right knee  Unsteadiness on feet  ONSET DATE: exacerbating  for last 6 months  SUBJECTIVE:                                                                                                                                                                                           SUBJECTIVE STATEMENT: Pt reports balance isn't good in tandem stance. Working on keeping upright with walking.   POOL ACCESS: considering joining [employer].   From evaluation:  I have had back problems  for years.  Have bilateral knee pain need an OA.  Saw Dr Beuford for my back and he said I was not a surgical candidate.  I also saw Dr Burnetta but didn't really think he did much.  I do have an appt with a nuero guy Dr Joshua next week.  Not sure which one is making the other worse knee vs back.  Had knee injections  which help some but the back injection didn't help.  Stopped with exercising 4 x a week back 6 months ago due to back pain  PERTINENT HISTORY:  history of hyperlipidemia glaucoma, fibromyalgia, spinal stenosis. With recent spinal injection.  Went to ER 5/4 for LBP with sciatica  PAIN:  Are you having pain? Yes: NPRS scale: 7/10 R knee,  2/10 lower back  Pain location: see above  Pain description: achy Aggravating factors: standing/weight bearing; working in garden 30 min Relieving factors: sitting, oxycodone     PRECAUTIONS: None  RED FLAGS: None   WEIGHT BEARING RESTRICTIONS: No  FALLS:  Has patient fallen in last 6 months? No  LIVING ENVIRONMENT: Lives with: lives with their spouse Lives in: House/apartment  OCCUPATION: retired  PLOF: Independent  PATIENT GOALS: decrease pain, walk longer  NEXT MD VISIT: Dr Joshua Next week  OBJECTIVE:  Note: Objective measures were completed at Evaluation unless otherwise noted.  DIAGNOSTIC FINDINGS:  X-ray hip MPRESSION: 1. No acute findings. 2. Lumbar spondylosis.  PATIENT SURVEYS:  22/50=44% 09/23/23: 24%  COGNITION: Overall cognitive status: Within functional limits for tasks assessed      MUSCLE  LENGTH: Hamstrings: tested in sitting tight R>L   POSTURE: rounded shoulders, forward head, decreased lumbar lordosis, and weight shift left  PALPATION: TTP throughout lower thoracic and lumbar pain left glut. Crepitus bilat patella  LUMBAR ROM:   AROM eval  Flexion full  Extension Neutral P!  Right lateral flexion Full P!  Left lateral flexion Full P!  Right rotation   Left rotation    (Blank rows = not tested)  LOWER EXTREMITY ROM:     wfl  LOWER EXTREMITY MMT:    HD (lbs) Tested in sitting Right eval Left eval Right 09/23/23 Left 09/23/23  Hip flexion 32.7 33.2 42.3 43.5  Hip extension      Hip abduction 23.0 21.2 45.8 43  Hip adduction      Hip internal rotation      Hip external rotation      Knee flexion      Knee extension 34.2 22.5 38.1 46  Ankle dorsiflexion      Ankle plantarflexion      Ankle inversion      Ankle eversion       (Blank rows = not tested)  LUMBAR SPECIAL TESTS:  Slump test: Negative  FUNCTIONAL TESTS:  5 times sit to stand: 20.96 Timed up and go (TUG): 19.04  BERG Balance Test          Date: eval  Sit to Stand 3 09/23/23: 3  Standing unsupported 4 4  Sitting with back unsupported but feet supported 4 4  Stand to sit  2 4  Transfers  3 3  Standing unsupported with eyes closed 2 4  Standing unsupported feet together 3 4  From standing position, reach forward with outstretched arm 3 3  From standing position, pick up object from floor 3 3  From standing position, turn and look behind over each shoulder 2 3  Turn 360 2 3  Standing unsupported, alternately  place foot on step 1 3  Standing unsupported, one foot in front 0 0  Standing on one leg 0 0  Total:  32/56 41/56     GAIT: Distance walked: 400 ft Assistive device utilized: None Level of assistance: Complete Independence Comments: Increased knee flex/step height (march like), trunk guarding and antalgic limp through LLE  TREATMENT                                                                                                                          09/30/23 Leg press 10# 3 x 10 (RPE 7/10) Hamstring curl machine 10# 2 x 15 (RPE 5/10) Leg extension machine 10# 2 x 10 (RPE 5/10) Hip adduction machine 70# 2 x 10 (RPE 6/10) Hip abduction machine 70# 2 x 10 (RPE 7/10) Nustep 5 minutes level 5 seat 9  [facility] Adult PT Treatment:                                             09/29/23 Pt seen for aquatic therapy today.  Treatment took place in water 3.5-4.75 ft in depth at the [facility] pool. Temp of water was 91.  Pt entered/exited the pool via stairs independently with bilat rail.  - unsupported walking forward/ backward - cues for even step length - side stepping without support -> with arm addct / abdct with rainbow hand floats  -tandem walking forward and back ue support RBHB x 4 widths (good challenge) - UE on rainbow hand floats: leg swings 2 x 5; 3 way LE kick x 8 each LE   - suitcase carry marching backward/forward with single/ bilat rainbow hand floats at side, multiple reps - TrA set in staggered stance then wide stance with solid noodle pull down to thighs x 10 each, cues to slow speed  -stair climbing negotiation out then into pool -bilat gastroc stretch with heels off of step x 20s, cues for posture and technique - standing hamstring stretch with heel on 3rd step x 15s     09/23/23 Nustep 5 minutes level 5 Reassessment STS 1 x 10    09/18/23 Nustep 4 minutes level 5 Standing hip abduction RTB at knees 2 x 10 Standing hip extension RTB at knees 2 x 10 Supine SLR 2 x 10 PPT x 5 PPT with knee ext/flex; SLR Tandem stance 3 x 30 second holds     [facility] Adult PT Treatment:                                             09/12/23 Pt seen for aquatic therapy today.  Treatment took place in water 3.5-4.75 ft in depth at the [facility] pool. Temp of water was 91.  Pt entered/exited the  pool via stairs independently with bilat rail.  -  unsupported walking forward/ backward - cues for even step length - side stepping without support -> with arm addct / abdct with rainbow hand floats  - suitcase carry marching backward/forward with single/ bilat rainbow hand floats at side, multiple reps - UE on rainbow hand floats: 3 way LE kick (toe tap when in ext) x 5 each LE   - high knee marching forward/ backward with alternating row with hand floats - TrA set in staggered stance with full hollow noodle pull down to thighs x 10 each, cues to slow speed  - tandem gait forward/ backward with UE on noodle, cues to slow spped - straddling noodle and UE support on corner: cycling, hip abdct/addct  - standing hamstring stretch with heel on 3rd step x 15s  -bilat gastroc stretch with heels off of step x 20s, cues for posture and technique  09/09/23 Standing hip abduction RTB at knees 2 x 10 Standing hip extension RTB at knees 2 x 10 Step up 4 inch 2 x 10 Tandem stance 3 x 30 second holds STS with 2 airex 2 x 10  Standing palof press RTB 2 x 10 Nustep 2 minutes level 5  09/02/23 Standing hip abduction 2 x 10 Standing hip extension 2 x 10 Step up 4 inch 2 x 10 Standing row GTB 2 x 10 Standing shoulder extension GTB 2 x 10 LAQ 1 x 10 with 5 second holds Mini squat 1 x 10 - difficulty with mechanics STS with 2 airex 2 x 10    [facility] Adult PT Treatment:                                             09/01/23 Pt seen for aquatic therapy today.  Treatment took place in water 3.5-4.75 ft in depth at the [facility] pool. Temp of water was 91.  Pt entered/exited the pool via stairs independently with bilat rail.  - unsupported walking forward/ backward - cues for even step length - suitcase carry marching backward/forward with single yellow hand floats at side-> bilat rainbow hand floats - tandem gait forward/ backward with UE on yellow hand floats, cues to slow spped - UE on yellow hand floats:  LE swings into hip abdct/ addct 2 x 10  (cues to control height of leg), hip flex/ext x 10 each LE - in 37ft6: SLS x 15sec x 2 reps LLE, 1 RLE (very difficult on LLE, hops to steady) - walking forward for rest and recovery  - arms crossed at chest: 3 way toe tap x 10 each LE - grapevine R/L x 2 - squats pushing single rainbow -> yellow hand float under water x 15 - seated on bench in water: cycling, hip abdct/ addct; alternating LAQ; cycling (some R hip pain) - standing hamstring stretch with heel on 3rd step x 15s  -figure 4 stretch ue support on hand rails    Pt requires the buoyancy and hydrostatic pressure of water for support, and to offload joints by unweighting joint load by at least 50 % in navel deep water and by at least 75-80% in chest to neck deep water.  Viscosity of the water is needed for resistance of strengthening. Water current perturbations provides challenge to standing balance requiring increased core activation.      PATIENT EDUCATION:  Education  details: reacquainting with aquatic therapy, 09/02/23: HEP  Person educated: Patient Education method: Explanation and Handouts Education comprehension: verbalized understanding  HOME EXERCISE PROGRAM: Access Code: [email or web address] URL: [email or web address] Date: 09/02/2023 Prepared by: Prentice Cyntha Brickman  Exercises - Standing Hip Abduction with Counter Support  - 1 x daily - 7 x weekly - 2 sets - 10 reps - Standing Hip Extension with Counter Support  - 1 x daily - 7 x weekly - 2 sets - 10 reps - Seated Long Arc Quad  - 1 x daily - 7 x weekly - 1-2 sets - 10 reps - 5 second hold  ASSESSMENT:  CLINICAL IMPRESSION: Began machine strengthening today so patient can feel comfortable doing independently in the future. Resistance graded on RPE scale. Discussed importance of controlled movements utilizing available ROM. Patient will continue to benefit from physical therapy in order to improve function and reduce impairment.   PN/re-cert Patient has met  1/4 short term goals and 3/5 long term goals with ability to complete HEP and improvement in symptoms, strength, activity tolerance, gait, balance, and functional mobility. Remaining goals not met due to continued deficits in symptoms, activity tolerance, functional mobility, balance. Patient has made good progress toward remaining goals. Extending POC 1-2x/week for 6 more weeks. Patient getting injection today which will hopefully allow for progressive functional mobility and strengthening. Patient will continue to benefit from skilled physical therapy in order to improve function and reduce impairment.       From initial evaluation:  Patient is a 77 y.o. f who was seen today for physical therapy evaluation and treatment for LBP.  Pt presents with gait deviation due to pain in bilateral knees (R>L) and LBP left lumbar area with radicular pattern into left leg to knee.  She demonstrates a strength deficit and is a high fall risk as per Lars.  She has been told she is not a surgical candidate for back but is for a TKR which she has been delaying in attempts to avoid. She has seen many Mds of the same specialty trying to get a consensus of forward treatment on LB but has not had any success other than being referred over to aquatic PT. She does have an appt to see her neurologist next week. She is a good candidate for skill PT intervention both aquatic and land to progress her toward improvement in functional mobility and Adl's as well as decreasing fall risk.   OBJECTIVE IMPAIRMENTS: Abnormal gait, decreased activity tolerance, decreased balance, decreased knowledge of use of DME, decreased mobility, decreased ROM, decreased strength, and pain.   ACTIVITY LIMITATIONS: lifting, bending, squatting, sleeping, stairs, transfers, and locomotion level  PARTICIPATION LIMITATIONS: meal prep, cleaning, shopping, community activity, and yard work  PERSONAL FACTORS: Time since onset of  injury/illness/exacerbation and 1-2 comorbidities: see PmHx are also affecting patient's functional outcome.   REHAB POTENTIAL: Good  CLINICAL DECISION MAKING: Evolving/moderate complexity  EVALUATION COMPLEXITY: Moderate   GOALS: Goals reviewed with patient? Yes  SHORT TERM GOALS: Target date: 08/21/23  Pt will tolerate full aquatic sessions consistently without increase in pain and with improving function to demonstrate good toleration and effectiveness of intervention.  Baseline: Goal status: MET - 08/29/23  2.  Pt will return to regular exercise weekly Baseline:  Goal status: In progress 09/18/23  3.  Pt will tolerate stair climbing using  combination of alternating and step to pattern ascending and descending 6 steps without use of handrail  Baseline:  Goal status: In progress 09/18/23  4.  Pt will report good toleration to gardening without excessive pain Baseline:  Goal status: in progress 09/18/23    LONG TERM GOALS: Target date: 09/26/23  Pt to improve on ODI by 13-15% to demonstrate statistically significant Improvement in function. Baseline: 22/50=44% Goal status: MET  2.  Pt will be indep with final HEP's (land and aquatic as appropriate) for continued management of condition Baseline:  Goal status: INITIAL  3.  Pt will improve strength in hips by at least 5 lbs to demonstrate improved overall physical function Baseline:  Goal status: MET  4.  Pt will improve on Berg balance test to >/= 45/56 to demonstrate a decrease in fall risk. Baseline:32/56  09/23/23: 41/56 Goal status: INITIAL  5.  Pt will report decrease in pain by at least 50% for improved toleration to activity/quality of life and to demonstrate improved management of pain. Baseline: see chart Goal status: MET  PLAN:  PT FREQUENCY: 2x/week  PT DURATION: 6 weeks  PLANNED INTERVENTIONS: 97164- PT Re-evaluation, 97110-Therapeutic exercises, 97530- Therapeutic activity, 97112- Neuromuscular  re-education, 97535- Self Care, [id number]- Manual therapy, [phone]- Gait training, [phone]- Orthotic Initial, [phone]- Aquatic Therapy, [phone]- Ionotophoresis 4mg /ml Dexamethasone , Patient/Family education, Balance training, Stair training, Taping, Dry Needling, Joint mobilization, DME instructions, Cryotherapy, and Moist heat.  PLAN FOR NEXT SESSION: Land/aquatic: le and core strengthening, gait training, balance retraining, transitional movements/STS transfers; activity toleration. Use of AD as approp    Prentice GORMAN Stains, PT, DPT 09/30/2023, 9:37 AM  [facility] [address] [place], [place], [address] Phone: [phone]   Fax:  [phone]    Date of referral: 06/19/23 Referring provider: Beuford Referring diagnosis? LBP Treatment diagnosis? (if different than referring diagnosis) LBP and Knee pain  What was this (referring dx) caused by? Ongoing Issue and Arthritis  Lysle of Condition: Chronic (continuous duration > 3 months)   Laterality: Both  Current Functional Measure Score: Other ODI 44%  Objective measurements identify impairments when they are compared to normal values, the uninvolved extremity, and prior level of function.  [x]  Yes  []  No  Objective assessment of functional ability: Moderate functional limitations   Briefly describe symptoms: Pain lb radicularr pattern into lle; right knee pain  How did symptoms start: chronic  Average pain intensity:  Last 24 hours: 7/10  Past week: 7/10  How often does the pt experience symptoms? Constantly  How much have the symptoms interfered with usual daily activities? Quite a bit  How has condition changed since care began at this facility? NA - initial visit  In general, how is the patients overall health? Good   BACK PAIN (STarT Back Screening Tool) Has pain spread down the leg(s) at some time in the last 2 weeks? yes Has the pt only walked short distances because  of back pain? yes Does patient think it's not safe for a person with this condition to be physically active? yes Does patient feel back pain is terrible and will never get any better? yes Has patient stopped enjoying things they usually enjoy? yes

## 2023-10-03 ENCOUNTER — Encounter (HOSPITAL_BASED_OUTPATIENT_CLINIC_OR_DEPARTMENT_OTHER): Payer: Self-pay

## 2023-10-03 ENCOUNTER — Encounter (HOSPITAL_BASED_OUTPATIENT_CLINIC_OR_DEPARTMENT_OTHER): Admitting: Physical Therapy

## 2023-10-07 ENCOUNTER — Ambulatory Visit (HOSPITAL_BASED_OUTPATIENT_CLINIC_OR_DEPARTMENT_OTHER): Admitting: Physical Therapy

## 2023-10-07 ENCOUNTER — Encounter (HOSPITAL_BASED_OUTPATIENT_CLINIC_OR_DEPARTMENT_OTHER): Payer: Self-pay | Admitting: Physical Therapy

## 2023-10-07 DIAGNOSIS — G8929 Other chronic pain: Secondary | ICD-10-CM

## 2023-10-07 DIAGNOSIS — M5459 Other low back pain: Secondary | ICD-10-CM | POA: Diagnosis not present

## 2023-10-07 DIAGNOSIS — R2681 Unsteadiness on feet: Secondary | ICD-10-CM

## 2023-10-07 DIAGNOSIS — M6281 Muscle weakness (generalized): Secondary | ICD-10-CM

## 2023-10-07 NOTE — Therapy (Signed)
 OUTPATIENT PHYSICAL THERAPY THORACOLUMBAR TREATMENT   Patient Name: Autumn Paul MRN: [id number] DOB:December 15, 1945, 78 y.o., female Today's Date: 10/07/2023   END OF SESSION:  PT End of Session - 10/07/23 1348     Visit Number 13    Number of Visits 28    Date for PT Re-Evaluation 11/04/23    Authorization Type [employer]    Authorization Time Period 8 visits approved   09/09/23-11/04/23    Authorization - Visit Number 5    Authorization - Number of Visits 8    Progress Note Due on Visit 20    PT Start Time 1347    PT Stop Time 1427    PT Time Calculation (min) 40 min    Activity Tolerance Patient tolerated treatment well    Behavior During Therapy WFL for tasks assessed/performed             Past Medical History:  Diagnosis Date   Fibromyalgia    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    uses Timolol   Hyperlipidemia    Osteoporosis    Spinal stenosis    Past Surgical History:  Procedure Laterality Date   CARPAL TUNNEL RELEASE     right wrist   CATARACT EXTRACTION     COLONOSCOPY     RHINOPLASTY     TUBAL LIGATION     Patient Active Problem List   Diagnosis Date Noted   After cataract of left eye not obscuring vision 07/26/2014   Glaucoma suspect, both eyes 08/02/2013   Conjunctivochalasis 04/07/2012   Dermatochalasis 04/07/2012   Dry eyes 04/07/2012   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   Meibomian gland dysfunction 04/07/2012   Myopia with astigmatism and presbyopia 04/07/2012   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   Posterior vitreous detachment 04/07/2012   Vitreous syneresis 04/07/2012    PCP: Elsie Gentry MD  REFERRING PROVIDER:   Beuford Anes, MD    REFERRING DIAG: M54.50 (ICD-10-CM) - Low back pain, unspecified   Rationale for Evaluation and Treatment: Rehabilitation  THERAPY DIAG:  Other low back pain  Muscle weakness (generalized)  Chronic pain of right knee  Unsteadiness on feet  ONSET DATE: exacerbating  for last 6 months  SUBJECTIVE:                                                                                                                                                                                           SUBJECTIVE STATEMENT: Pt reports balance isn't good in tandem stance. Working on keeping upright with walking.   POOL ACCESS: considering joining [employer].   From evaluation:  I have had back problems  for years.  Have bilateral knee pain need an OA.  Saw Dr Beuford for my back and he said I was not a surgical candidate.  I also saw Dr Burnetta but didn't really think he did much.  I do have an appt with a nuero guy Dr Joshua next week.  Not sure which one is making the other worse knee vs back.  Had knee injections  which help some but the back injection didn't help.  Stopped with exercising 4 x a week back 6 months ago due to back pain  PERTINENT HISTORY:  history of hyperlipidemia glaucoma, fibromyalgia, spinal stenosis. With recent spinal injection.  Went to ER 5/4 for LBP with sciatica  PAIN:  Are you having pain? Yes: NPRS scale: 7/10 R knee,  2/10 lower back  Pain location: see above  Pain description: achy Aggravating factors: standing/weight bearing; working in garden 30 min Relieving factors: sitting, oxycodone     PRECAUTIONS: None  RED FLAGS: None   WEIGHT BEARING RESTRICTIONS: No  FALLS:  Has patient fallen in last 6 months? No  LIVING ENVIRONMENT: Lives with: lives with their spouse Lives in: House/apartment  OCCUPATION: retired  PLOF: Independent  PATIENT GOALS: decrease pain, walk longer  NEXT MD VISIT: Dr Joshua Next week  OBJECTIVE:  Note: Objective measures were completed at Evaluation unless otherwise noted.  DIAGNOSTIC FINDINGS:  X-ray hip MPRESSION: 1. No acute findings. 2. Lumbar spondylosis.  PATIENT SURVEYS:  22/50=44% 09/23/23: 24%  COGNITION: Overall cognitive status: Within functional limits for tasks assessed      MUSCLE  LENGTH: Hamstrings: tested in sitting tight R>L   POSTURE: rounded shoulders, forward head, decreased lumbar lordosis, and weight shift left  PALPATION: TTP throughout lower thoracic and lumbar pain left glut. Crepitus bilat patella  LUMBAR ROM:   AROM eval  Flexion full  Extension Neutral P!  Right lateral flexion Full P!  Left lateral flexion Full P!  Right rotation   Left rotation    (Blank rows = not tested)  LOWER EXTREMITY ROM:     wfl  LOWER EXTREMITY MMT:    HD (lbs) Tested in sitting Right eval Left eval Right 09/23/23 Left 09/23/23  Hip flexion 32.7 33.2 42.3 43.5  Hip extension      Hip abduction 23.0 21.2 45.8 43  Hip adduction      Hip internal rotation      Hip external rotation      Knee flexion      Knee extension 34.2 22.5 38.1 46  Ankle dorsiflexion      Ankle plantarflexion      Ankle inversion      Ankle eversion       (Blank rows = not tested)  LUMBAR SPECIAL TESTS:  Slump test: Negative  FUNCTIONAL TESTS:  5 times sit to stand: 20.96 Timed up and go (TUG): 19.04  BERG Balance Test          Date: eval  Sit to Stand 3 09/23/23: 3  Standing unsupported 4 4  Sitting with back unsupported but feet supported 4 4  Stand to sit  2 4  Transfers  3 3  Standing unsupported with eyes closed 2 4  Standing unsupported feet together 3 4  From standing position, reach forward with outstretched arm 3 3  From standing position, pick up object from floor 3 3  From standing position, turn and look behind over each shoulder 2 3  Turn 360 2 3  Standing unsupported, alternately  place foot on step 1 3  Standing unsupported, one foot in front 0 0  Standing on one leg 0 0  Total:  32/56 41/56     GAIT: Distance walked: 400 ft Assistive device utilized: None Level of assistance: Complete Independence Comments: Increased knee flex/step height (march like), trunk guarding and antalgic limp through LLE  TREATMENT                                                                                                                          10/07/23 Hamstring curl machine 40# 3 x 12 (RPE 5/10) Leg press 15# 3 x 15 (RPE 5/10) Hip adduction machine 70# 3 x 10 (RPE 6/10) Hip abduction machine 75# 2 x 10, 90# 2 x 12 (RPE 7/10) Seated cable row 15# 1 x 10 (RPE 3/10), 30# 2x 10 (RPE 5/10) Lat pull down machine 35# 2 x 10    09/30/23 Leg press 10# 3 x 10 (RPE 7/10) Hamstring curl machine 10# 2 x 15 (RPE 5/10) Leg extension machine 10# 2 x 10 (RPE 5/10) Hip adduction machine 70# 2 x 10 (RPE 6/10) Hip abduction machine 70# 2 x 10 (RPE 7/10) Nustep 5 minutes level 5 seat 9  [facility] Adult PT Treatment:                                             09/29/23 Pt seen for aquatic therapy today.  Treatment took place in water 3.5-4.75 ft in depth at the [facility] pool. Temp of water was 91.  Pt entered/exited the pool via stairs independently with bilat rail.  - unsupported walking forward/ backward - cues for even step length - side stepping without support -> with arm addct / abdct with rainbow hand floats  -tandem walking forward and back ue support RBHB x 4 widths (good challenge) - UE on rainbow hand floats: leg swings 2 x 5; 3 way LE kick x 8 each LE   - suitcase carry marching backward/forward with single/ bilat rainbow hand floats at side, multiple reps - TrA set in staggered stance then wide stance with solid noodle pull down to thighs x 10 each, cues to slow speed  -stair climbing negotiation out then into pool -bilat gastroc stretch with heels off of step x 20s, cues for posture and technique - standing hamstring stretch with heel on 3rd step x 15s     09/23/23 Nustep 5 minutes level 5 Reassessment STS 1 x 10    09/18/23 Nustep 4 minutes level 5 Standing hip abduction RTB at knees 2 x 10 Standing hip extension RTB at knees 2 x 10 Supine SLR 2 x 10 PPT x 5 PPT with knee ext/flex; SLR Tandem stance 3 x 30 second holds     [facility]  Adult PT Treatment:  09/12/23 Pt seen for aquatic therapy today.  Treatment took place in water 3.5-4.75 ft in depth at the [facility] pool. Temp of water was 91.  Pt entered/exited the pool via stairs independently with bilat rail.  - unsupported walking forward/ backward - cues for even step length - side stepping without support -> with arm addct / abdct with rainbow hand floats  - suitcase carry marching backward/forward with single/ bilat rainbow hand floats at side, multiple reps - UE on rainbow hand floats: 3 way LE kick (toe tap when in ext) x 5 each LE   - high knee marching forward/ backward with alternating row with hand floats - TrA set in staggered stance with full hollow noodle pull down to thighs x 10 each, cues to slow speed  - tandem gait forward/ backward with UE on noodle, cues to slow spped - straddling noodle and UE support on corner: cycling, hip abdct/addct  - standing hamstring stretch with heel on 3rd step x 15s  -bilat gastroc stretch with heels off of step x 20s, cues for posture and technique  09/09/23 Standing hip abduction RTB at knees 2 x 10 Standing hip extension RTB at knees 2 x 10 Step up 4 inch 2 x 10 Tandem stance 3 x 30 second holds STS with 2 airex 2 x 10  Standing palof press RTB 2 x 10 Nustep 2 minutes level 5  09/02/23 Standing hip abduction 2 x 10 Standing hip extension 2 x 10 Step up 4 inch 2 x 10 Standing row GTB 2 x 10 Standing shoulder extension GTB 2 x 10 LAQ 1 x 10 with 5 second holds Mini squat 1 x 10 - difficulty with mechanics STS with 2 airex 2 x 10    [facility] Adult PT Treatment:                                             09/01/23 Pt seen for aquatic therapy today.  Treatment took place in water 3.5-4.75 ft in depth at the [facility] pool. Temp of water was 91.  Pt entered/exited the pool via stairs independently with bilat rail.  - unsupported walking forward/ backward  - cues for even step length - suitcase carry marching backward/forward with single yellow hand floats at side-> bilat rainbow hand floats - tandem gait forward/ backward with UE on yellow hand floats, cues to slow spped - UE on yellow hand floats:  LE swings into hip abdct/ addct 2 x 10 (cues to control height of leg), hip flex/ext x 10 each LE - in 33ft6: SLS x 15sec x 2 reps LLE, 1 RLE (very difficult on LLE, hops to steady) - walking forward for rest and recovery  - arms crossed at chest: 3 way toe tap x 10 each LE - grapevine R/L x 2 - squats pushing single rainbow -> yellow hand float under water x 15 - seated on bench in water: cycling, hip abdct/ addct; alternating LAQ; cycling (some R hip pain) - standing hamstring stretch with heel on 3rd step x 15s  -figure 4 stretch ue support on hand rails    Pt requires the buoyancy and hydrostatic pressure of water for support, and to offload joints by unweighting joint load by at least 50 % in navel deep water and by at least 75-80% in chest to neck deep water.  Viscosity  of the water is needed for resistance of strengthening. Water current perturbations provides challenge to standing balance requiring increased core activation.      PATIENT EDUCATION:  Education details: reacquainting with aquatic therapy, 09/02/23: HEP  Person educated: Patient Education method: Explanation and Handouts Education comprehension: verbalized understanding  HOME EXERCISE PROGRAM: Access Code: [email or web address] URL: [email or web address] Date: 09/02/2023 Prepared by: Prentice Sylus Stgermain  Exercises - Standing Hip Abduction with Counter Support  - 1 x daily - 7 x weekly - 2 sets - 10 reps - Standing Hip Extension with Counter Support  - 1 x daily - 7 x weekly - 2 sets - 10 reps - Seated Long Arc Quad  - 1 x daily - 7 x weekly - 1-2 sets - 10 reps - 5 second hold  ASSESSMENT:  CLINICAL IMPRESSION: Continued with machine strengthening and patient  tolerating progressions well in reps and weight.  Cueing given for core activation with UE strengthening. Assist given for set up and proper resistance for all machines. Patient will continue to benefit from physical therapy in order to improve function and reduce impairment.   PN/re-cert Patient has met 1/4 short term goals and 3/5 long term goals with ability to complete HEP and improvement in symptoms, strength, activity tolerance, gait, balance, and functional mobility. Remaining goals not met due to continued deficits in symptoms, activity tolerance, functional mobility, balance. Patient has made good progress toward remaining goals. Extending POC 1-2x/week for 6 more weeks. Patient getting injection today which will hopefully allow for progressive functional mobility and strengthening. Patient will continue to benefit from skilled physical therapy in order to improve function and reduce impairment.       From initial evaluation:  Patient is a 78 y.o. f who was seen today for physical therapy evaluation and treatment for LBP.  Pt presents with gait deviation due to pain in bilateral knees (R>L) and LBP left lumbar area with radicular pattern into left leg to knee.  She demonstrates a strength deficit and is a high fall risk as per Lars.  She has been told she is not a surgical candidate for back but is for a TKR which she has been delaying in attempts to avoid. She has seen many Mds of the same specialty trying to get a consensus of forward treatment on LB but has not had any success other than being referred over to aquatic PT. She does have an appt to see her neurologist next week. She is a good candidate for skill PT intervention both aquatic and land to progress her toward improvement in functional mobility and Adl's as well as decreasing fall risk.   OBJECTIVE IMPAIRMENTS: Abnormal gait, decreased activity tolerance, decreased balance, decreased knowledge of use of DME, decreased mobility,  decreased ROM, decreased strength, and pain.   ACTIVITY LIMITATIONS: lifting, bending, squatting, sleeping, stairs, transfers, and locomotion level  PARTICIPATION LIMITATIONS: meal prep, cleaning, shopping, community activity, and yard work  PERSONAL FACTORS: Time since onset of injury/illness/exacerbation and 1-2 comorbidities: see PmHx are also affecting patient's functional outcome.   REHAB POTENTIAL: Good  CLINICAL DECISION MAKING: Evolving/moderate complexity  EVALUATION COMPLEXITY: Moderate   GOALS: Goals reviewed with patient? Yes  SHORT TERM GOALS: Target date: 08/21/23  Pt will tolerate full aquatic sessions consistently without increase in pain and with improving function to demonstrate good toleration and effectiveness of intervention.  Baseline: Goal status: MET - 08/29/23  2.  Pt will return to regular exercise weekly Baseline:  Goal status: In progress 09/18/23  3.  Pt will tolerate stair climbing using  combination of alternating and step to pattern ascending and descending 6 steps without use of handrail  Baseline:  Goal status: In progress 09/18/23  4.  Pt will report good toleration to gardening without excessive pain Baseline:  Goal status: in progress 09/18/23    LONG TERM GOALS: Target date: 09/26/23  Pt to improve on ODI by 13-15% to demonstrate statistically significant Improvement in function. Baseline: 22/50=44% Goal status: MET  2.  Pt will be indep with final HEP's (land and aquatic as appropriate) for continued management of condition Baseline:  Goal status: INITIAL  3.  Pt will improve strength in hips by at least 5 lbs to demonstrate improved overall physical function Baseline:  Goal status: MET  4.  Pt will improve on Berg balance test to >/= 45/56 to demonstrate a decrease in fall risk. Baseline:32/56  09/23/23: 41/56 Goal status: INITIAL  5.  Pt will report decrease in pain by at least 50% for improved toleration to activity/quality of life  and to demonstrate improved management of pain. Baseline: see chart Goal status: MET  PLAN:  PT FREQUENCY: 2x/week  PT DURATION: 6 weeks  PLANNED INTERVENTIONS: 97164- PT Re-evaluation, 97110-Therapeutic exercises, 97530- Therapeutic activity, 97112- Neuromuscular re-education, 97535- Self Care, [id number]- Manual therapy, [phone]- Gait training, [phone]- Orthotic Initial, [phone]- Aquatic Therapy, [phone]- Ionotophoresis 4mg /ml Dexamethasone , Patient/Family education, Balance training, Stair training, Taping, Dry Needling, Joint mobilization, DME instructions, Cryotherapy, and Moist heat.  PLAN FOR NEXT SESSION: Land/aquatic: le and core strengthening, gait training, balance retraining, transitional movements/STS transfers; activity toleration. Use of AD as approp    Prentice GORMAN Stains, PT, DPT 10/07/2023, 2:29 PM  [facility] [address] [place], [place], [address] Phone: [phone]   Fax:  [phone]    Date of referral: 06/19/23 Referring provider: Beuford Referring diagnosis? LBP Treatment diagnosis? (if different than referring diagnosis) LBP and Knee pain  What was this (referring dx) caused by? Ongoing Issue and Arthritis  Lysle of Condition: Chronic (continuous duration > 3 months)   Laterality: Both  Current Functional Measure Score: Other ODI 44%  Objective measurements identify impairments when they are compared to normal values, the uninvolved extremity, and prior level of function.  [x]  Yes  []  No  Objective assessment of functional ability: Moderate functional limitations   Briefly describe symptoms: Pain lb radicularr pattern into lle; right knee pain  How did symptoms start: chronic  Average pain intensity:  Last 24 hours: 7/10  Past week: 7/10  How often does the pt experience symptoms? Constantly  How much have the symptoms interfered with usual daily activities? Quite a bit  How has condition  changed since care began at this facility? NA - initial visit  In general, how is the patients overall health? Good   BACK PAIN (STarT Back Screening Tool) Has pain spread down the leg(s) at some time in the last 2 weeks? yes Has the pt only walked short distances because of back pain? yes Does patient think it's not safe for a person with this condition to be physically active? yes Does patient feel back pain is terrible and will never get any better? yes Has patient stopped enjoying things they usually enjoy? yes

## 2023-10-21 ENCOUNTER — Ambulatory Visit (HOSPITAL_BASED_OUTPATIENT_CLINIC_OR_DEPARTMENT_OTHER): Attending: Internal Medicine | Admitting: Physical Therapy

## 2023-10-21 ENCOUNTER — Encounter (HOSPITAL_BASED_OUTPATIENT_CLINIC_OR_DEPARTMENT_OTHER): Payer: Self-pay | Admitting: Physical Therapy

## 2023-10-21 DIAGNOSIS — G8929 Other chronic pain: Secondary | ICD-10-CM | POA: Insufficient documentation

## 2023-10-21 DIAGNOSIS — M25561 Pain in right knee: Secondary | ICD-10-CM | POA: Insufficient documentation

## 2023-10-21 DIAGNOSIS — M5459 Other low back pain: Secondary | ICD-10-CM | POA: Diagnosis present

## 2023-10-21 DIAGNOSIS — M6281 Muscle weakness (generalized): Secondary | ICD-10-CM | POA: Diagnosis present

## 2023-10-21 NOTE — Therapy (Signed)
 OUTPATIENT PHYSICAL THERAPY THORACOLUMBAR TREATMENT PHYSICAL THERAPY DISCHARGE SUMMARY  Visits from Start of Care: 14  Current functional level related to goals / functional outcomes: Indep with some difficulty due to pain   Remaining deficits: Chronic pain   Education / Equipment: Management of condition/ HEP   Patient agrees to discharge. Patient goals were partially met. Patient is being discharged due to being pleased with the current functional level.   Patient Name: Autumn Paul MRN: [id number] DOB:12-31-1945, 78 y.o., female Today's Date: 10/21/2023   END OF SESSION:  PT End of Session - 10/21/23 1101     Visit Number 14    Number of Visits 28    Date for PT Re-Evaluation 11/04/23    Authorization Type [employer]    Authorization Time Period 8 visits approved   09/09/23-11/04/23    Authorization - Number of Visits 8    Progress Note Due on Visit 20    PT Start Time 1101    PT Stop Time 1143    PT Time Calculation (min) 42 min    Activity Tolerance Patient tolerated treatment well    Behavior During Therapy WFL for tasks assessed/performed             Past Medical History:  Diagnosis Date   Fibromyalgia    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    uses Timolol   Hyperlipidemia    Osteoporosis    Spinal stenosis    Past Surgical History:  Procedure Laterality Date   CARPAL TUNNEL RELEASE     right wrist   CATARACT EXTRACTION     COLONOSCOPY     RHINOPLASTY     TUBAL LIGATION     Patient Active Problem List   Diagnosis Date Noted   After cataract of left eye not obscuring vision 07/26/2014   Glaucoma suspect, both eyes 08/02/2013   Conjunctivochalasis 04/07/2012   Dermatochalasis 04/07/2012   Dry eyes 04/07/2012   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   Meibomian gland dysfunction 04/07/2012   Myopia with astigmatism and presbyopia 04/07/2012   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 Working on keeping upright with walking.   POOL ACCESS: considering joining [employer].   From evaluation:  I have had back problems for years.  Have bilateral knee pain need an OA.  Saw Dr Beuford for my back and he said I was not a surgical candidate.  I also saw Dr Burnetta but didn't really think he did much.  I do have an appt with a nuero guy Dr Joshua next week.  Not sure which one is making the other worse knee vs back.  Had knee injections  which help some but the back injection didn't help.  Stopped with exercising 4 x a week back 6 months ago due to back pain  PERTINENT HISTORY:  history of hyperlipidemia glaucoma, fibromyalgia, spinal stenosis. With recent spinal injection.  Went to ER 5/4 for LBP with sciatica  PAIN:  Are you having pain? Yes: NPRS scale: 7/10 R knee,  2/10 lower back  Pain location: see above  Pain description: achy Aggravating factors: standing/weight bearing; working in garden 30 min Relieving factors: sitting, oxycodone     PRECAUTIONS: None  RED FLAGS: None   WEIGHT BEARING RESTRICTIONS: No  FALLS:  Has patient fallen in last 6 months? No  LIVING ENVIRONMENT: Lives with: lives with their spouse Lives in: House/apartment  OCCUPATION: retired  PLOF: Independent  PATIENT GOALS: decrease pain, walk longer  NEXT  MD VISIT: Dr Joshua Next week  OBJECTIVE:  Note: Objective measures were completed at Evaluation unless otherwise noted.  DIAGNOSTIC FINDINGS:  X-ray hip MPRESSION: 1. No acute findings. 2. Lumbar spondylosis.  PATIENT SURVEYS:  22/50=44% 09/23/23: 24%  COGNITION: Overall cognitive status: Within functional limits for tasks assessed      MUSCLE LENGTH: Hamstrings: tested in sitting tight R>L   POSTURE: rounded shoulders, forward head, decreased lumbar lordosis, and weight shift left  PALPATION: TTP throughout lower thoracic and lumbar pain left glut. Crepitus bilat patella  LUMBAR ROM:   AROM eval  Flexion full  Extension Neutral P!  Right lateral flexion Full P!  Left lateral flexion Full P!  Right rotation   Left rotation    (Blank rows = not tested)  LOWER EXTREMITY ROM:     wfl  LOWER EXTREMITY MMT:    HD (lbs) Tested in sitting Right eval Left eval Right 09/23/23 Left 09/23/23  Hip flexion 32.7 33.2 42.3 43.5  Hip extension      Hip abduction 23.0 21.2 45.8 43  Hip adduction      Hip internal rotation      Hip external rotation      Knee flexion      Knee extension 34.2 22.5 38.1 46  Ankle dorsiflexion      Ankle plantarflexion      Ankle inversion      Ankle eversion       (Blank rows = not tested)  LUMBAR SPECIAL TESTS:  Slump test: Negative  FUNCTIONAL TESTS:  5 times sit to stand: 20.96 Timed up and go (TUG): 19.04  BERG Balance Test          Date: eval  Sit to Stand 3 09/23/23: 3 10/21/23: 3  Standing unsupported 4 4 4   Sitting with back unsupported but feet supported 4 4 4   Stand to sit  2 4 4   Transfers  3 3 3   Standing unsupported with eyes closed 2 4 4   Standing unsupported feet together 3 4 4   From standing  position, reach forward with outstretched arm 3 3 4   From standing position, pick up object from floor 3 3 4   From standing position, turn and look behind over each shoulder 2 3 4   Turn 360 2 3 4   Standing unsupported,  alternately place foot on step 1 3 3   Standing unsupported, one foot in front 0 0 1  Standing on one leg 0 0 0  Total:  32/56 41/56 45/56      GAIT: Distance walked: 400 ft Assistive device utilized: None Level of assistance: Complete Independence Comments: Increased knee flex/step height (march like), trunk guarding and antalgic limp through LLE  TREATMENT                                                                                                                         10/21/23 Self care: see Impression DC testing: Lars, ODI  10/07/23 Hamstring curl machine 40# 3 x 12 (RPE 5/10) Leg press 15# 3 x 15 (RPE 5/10) Hip adduction machine 70# 3 x 10 (RPE 6/10) Hip abduction machine 75# 2 x 10, 90# 2 x 12 (RPE 7/10) Seated cable row 15# 1 x 10 (RPE 3/10), 30# 2x 10 (RPE 5/10) Lat pull down machine 35# 2 x 10    09/30/23 Leg press 10# 3 x 10 (RPE 7/10) Hamstring curl machine 10# 2 x 15 (RPE 5/10) Leg extension machine 10# 2 x 10 (RPE 5/10) Hip adduction machine 70# 2 x 10 (RPE 6/10) Hip abduction machine 70# 2 x 10 (RPE 7/10) Nustep 5 minutes level 5 seat 9  [facility] Adult PT Treatment:                                             09/29/23 Pt seen for aquatic therapy today.  Treatment took place in water 3.5-4.75 ft in depth at the [facility] pool. Temp of water was 91.  Pt entered/exited the pool via stairs independently with bilat rail.  - unsupported walking forward/ backward - cues for even step length - side stepping without support -> with arm addct / abdct with rainbow hand floats  -tandem walking forward and back ue support RBHB x 4 widths (good challenge) - UE on rainbow hand floats: leg swings 2 x 5; 3 way LE kick x 8 each LE   - suitcase carry marching backward/forward with single/ bilat rainbow hand floats at side, multiple reps - TrA set in staggered stance then wide stance with solid noodle pull down to thighs x 10 each, cues to slow speed  -stair climbing  negotiation out then into pool -bilat gastroc stretch with heels off of step x 20s, cues for posture and technique - standing hamstring stretch with heel on 3rd step x 15s     09/23/23 Nustep 5 minutes level 5 Reassessment STS 1 x 10    09/18/23 Nustep  4 minutes level 5 Standing hip abduction RTB at knees 2 x 10 Standing hip extension RTB at knees 2 x 10 Supine SLR 2 x 10 PPT x 5 PPT with knee ext/flex; SLR Tandem stance 3 x 30 second holds     [facility] Adult PT Treatment:                                             09/12/23 Pt seen for aquatic therapy today.  Treatment took place in water 3.5-4.75 ft in depth at the [facility] pool. Temp of water was 91.  Pt entered/exited the pool via stairs independently with bilat rail.  - unsupported walking forward/ backward - cues for even step length - side stepping without support -> with arm addct / abdct with rainbow hand floats  - suitcase carry marching backward/forward with single/ bilat rainbow hand floats at side, multiple reps - UE on rainbow hand floats: 3 way LE kick (toe tap when in ext) x 5 each LE   - high knee marching forward/ backward with alternating row with hand floats - TrA set in staggered stance with full hollow noodle pull down to thighs x 10 each, cues to slow speed  - tandem gait forward/ backward with UE on noodle, cues to slow spped - straddling noodle and UE support on corner: cycling, hip abdct/addct  - standing hamstring stretch with heel on 3rd step x 15s  -bilat gastroc stretch with heels off of step x 20s, cues for posture and technique  09/09/23 Standing hip abduction RTB at knees 2 x 10 Standing hip extension RTB at knees 2 x 10 Step up 4 inch 2 x 10 Tandem stance 3 x 30 second holds STS with 2 airex 2 x 10  Standing palof press RTB 2 x 10 Nustep 2 minutes level 5  09/02/23 Standing hip abduction 2 x 10 Standing hip extension 2 x 10 Step up 4 inch 2 x 10 Standing row GTB 2 x 10 Standing  shoulder extension GTB 2 x 10 LAQ 1 x 10 with 5 second holds Mini squat 1 x 10 - difficulty with mechanics STS with 2 airex 2 x 10    [facility] Adult PT Treatment:                                             09/01/23 Pt seen for aquatic therapy today.  Treatment took place in water 3.5-4.75 ft in depth at the [facility] pool. Temp of water was 91.  Pt entered/exited the pool via stairs independently with bilat rail.  - unsupported walking forward/ backward - cues for even step length - suitcase carry marching backward/forward with single yellow hand floats at side-> bilat rainbow hand floats - tandem gait forward/ backward with UE on yellow hand floats, cues to slow spped - UE on yellow hand floats:  LE swings into hip abdct/ addct 2 x 10 (cues to control height of leg), hip flex/ext x 10 each LE - in 65ft6: SLS x 15sec x 2 reps LLE, 1 RLE (very difficult on LLE, hops to steady) - walking forward for rest and recovery  - arms crossed at chest: 3 way toe tap x 10 each LE - grapevine R/L x 2 -  squats pushing single rainbow -> yellow hand float under water x 15 - seated on bench in water: cycling, hip abdct/ addct; alternating LAQ; cycling (some R hip pain) - standing hamstring stretch with heel on 3rd step x 15s  -figure 4 stretch ue support on hand rails    Pt requires the buoyancy and hydrostatic pressure of water for support, and to offload joints by unweighting joint load by at least 50 % in navel deep water and by at least 75-80% in chest to neck deep water.  Viscosity of the water is needed for resistance of strengthening. Water current perturbations provides challenge to standing balance requiring increased core activation.      PATIENT EDUCATION:  Education details: reacquainting with aquatic therapy, 09/02/23: HEP  Person educated: Patient Education method: Explanation and Handouts Education comprehension: verbalized understanding  HOME EXERCISE PROGRAM: Access Code:  [email or web address] URL: [email or web address] Date: 09/02/2023 Prepared by: Prentice Zaunegger  Exercises - Standing Hip Abduction with Counter Support  - 1 x daily - 7 x weekly - 2 sets - 10 reps - Standing Hip Extension with Counter Support  - 1 x daily - 7 x weekly - 2 sets - 10 reps - Seated Long Arc Quad  - 1 x daily - 7 x weekly - 1-2 sets - 10 reps - 5 second hold  ASSESSMENT:  CLINICAL IMPRESSION: Last authorized appt  today.  Reviewed HEPs; discussed exercise frequency and duration/return to [employer].  Pt edu on restless leg syndrome; speaking to MD in regards to gabapentin and memory. DC testing complete. Most goals met. She reports she has not completed daily exercise in the past 3 weeks, has membership at [employer]. Pt is in agreement with DC   PN/re-cert Patient has met 1/4 short term goals and 3/5 long term goals with ability to complete HEP and improvement in symptoms, strength, activity tolerance, gait, balance, and functional mobility. Remaining goals not met due to continued deficits in symptoms, activity tolerance, functional mobility, balance. Patient has made good progress toward remaining goals. Extending POC 1-2x/week for 6 more weeks. Patient getting injection today which will hopefully allow for progressive functional mobility and strengthening. Patient will continue to benefit from skilled physical therapy in order to improve function and reduce impairment.       From initial evaluation:  Patient is a 79 y.o. f who was seen today for physical therapy evaluation and treatment for LBP.  Pt presents with gait deviation due to pain in bilateral knees (R>L) and LBP left lumbar area with radicular pattern into left leg to knee.  She demonstrates a strength deficit and is a high fall risk as per Lars.  She has been told she is not a surgical candidate for back but is for a TKR which she has been delaying in attempts to avoid. She has seen many Mds of the same  specialty trying to get a consensus of forward treatment on LB but has not had any success other than being referred over to aquatic PT. She does have an appt to see her neurologist next week. She is a good candidate for skill PT intervention both aquatic and land to progress her toward improvement in functional mobility and Adl's as well as decreasing fall risk.   OBJECTIVE IMPAIRMENTS: Abnormal gait, decreased activity tolerance, decreased balance, decreased knowledge of use of DME, decreased mobility, decreased ROM, decreased strength, and pain.   ACTIVITY LIMITATIONS: lifting, bending, squatting, sleeping, stairs, transfers, and locomotion level  PARTICIPATION LIMITATIONS:  meal prep, cleaning, shopping, community activity, and yard work  PERSONAL FACTORS: Time since onset of injury/illness/exacerbation and 1-2 comorbidities: see PmHx are also affecting patient's functional outcome.   REHAB POTENTIAL: Good  CLINICAL DECISION MAKING: Evolving/moderate complexity  EVALUATION COMPLEXITY: Moderate   GOALS: Goals reviewed with patient? Yes  SHORT TERM GOALS: Target date: 08/21/23  Pt will tolerate full aquatic sessions consistently without increase in pain and with improving function to demonstrate good toleration and effectiveness of intervention.  Baseline: Goal status: MET - 08/29/23  2.  Pt will return to regular exercise weekly Baseline:  Goal status: In progress 09/18/23; not Met 10/21/23  3.  Pt will tolerate stair climbing using  combination of alternating and step to pattern ascending and descending 6 steps without use of handrail  Baseline:  Goal status: In progress 09/18/23; not met 10/21/23.  Limited by paiin  4.  Pt will report good toleration to gardening without excessive pain Baseline:  Goal status: in progress 09/18/23; met 10/21/23    LONG TERM GOALS: Target date: 09/26/23  Pt to improve on ODI by 13-15% to demonstrate statistically significant Improvement in  function. Baseline: 22/50=44%; 14/50= 28% Goal status: MET 09/23/23  2.  Pt will be indep with final HEP's (land and aquatic as appropriate) for continued management of condition Baseline:  Goal status: Met 10/21/23  3.  Pt will improve strength in hips by at least 5 lbs to demonstrate improved overall physical function Baseline:  Goal status: MET 09/23/23  4.  Pt will improve on Berg balance test to >/= 45/56 to demonstrate a decrease in fall risk. Baseline:32/56  09/23/23: 41/56; 45/56 Goal status: Met 10/21/23  5.  Pt will report decrease in pain by at least 50% for improved toleration to activity/quality of life and to demonstrate improved management of pain. Baseline: see chart Goal status: MET 09/23/23  PLAN:  PT FREQUENCY: 2x/week  PT DURATION: 6 weeks  PLANNED INTERVENTIONS: 97164- PT Re-evaluation, 97110-Therapeutic exercises, 97530- Therapeutic activity, 97112- Neuromuscular re-education, 97535- Self Care, 02859- Manual therapy, [phone]- Gait training, [phone]- Orthotic Initial, [phone]- Aquatic Therapy, [phone]- Ionotophoresis 4mg /ml Dexamethasone , Patient/Family education, Balance training, Stair training, Taping, Dry Needling, Joint mobilization, DME instructions, Cryotherapy, and Moist heat.  PLAN FOR NEXT SESSION: Land/aquatic: le and core strengthening, gait training, balance retraining, transitional movements/STS transfers; activity toleration. Use of AD as approp    Ronal Kem) Brighten Buzzelli MPT 10/21/23 12:53 PM [facility] [address] [place], [place], [address] Phone: [phone]   Fax:  [phone]     Date of referral: 06/19/23 Referring provider: [facility] Referring diagnosis? LBP Treatment diagnosis? (if different than referring diagnosis) LBP and Knee pain  What was this (referring dx) caused by? Ongoing Issue and Arthritis  Lysle of Condition: Chronic (continuous duration > 3 months)   Laterality:  Both  Current Functional Measure Score: Other ODI 44%  Objective measurements identify impairments when they are compared to normal values, the uninvolved extremity, and prior level of function.  [x]  Yes  []  No  Objective assessment of functional ability: Moderate functional limitations   Briefly describe symptoms: Pain lb radicularr pattern into lle; right knee pain  How did symptoms start: chronic  Average pain intensity:  Last 24 hours: 7/10  Past week: 7/10  How often does the pt experience symptoms? Constantly  How much have the symptoms interfered with usual daily activities? Quite a bit  How has condition changed since care began at this facility? NA - initial  visit  In general, how is the patients overall health? Good   BACK PAIN (STarT Back Screening Tool) Has pain spread down the leg(s) at some time in the last 2 weeks? yes Has the pt only walked short distances because of back pain? yes Does patient think it's not safe for a person with this condition to be physically active? yes Does patient feel back pain is terrible and will never get any better? yes Has patient stopped enjoying things they usually enjoy? yes

## 2023-10-22 ENCOUNTER — Encounter (HOSPITAL_BASED_OUTPATIENT_CLINIC_OR_DEPARTMENT_OTHER): Payer: Self-pay | Admitting: Physical Therapy

## 2023-10-28 ENCOUNTER — Encounter (HOSPITAL_BASED_OUTPATIENT_CLINIC_OR_DEPARTMENT_OTHER): Admitting: Physical Therapy

## 2023-10-29 ENCOUNTER — Encounter (HOSPITAL_BASED_OUTPATIENT_CLINIC_OR_DEPARTMENT_OTHER): Admitting: Physical Therapy

## 2024-01-20 ENCOUNTER — Other Ambulatory Visit: Payer: Self-pay | Admitting: Interventional Radiology

## 2024-01-20 ENCOUNTER — Other Ambulatory Visit: Payer: Self-pay | Admitting: Orthopedic Surgery

## 2024-01-20 DIAGNOSIS — M1711 Unilateral primary osteoarthritis, right knee: Secondary | ICD-10-CM

## 2024-01-26 ENCOUNTER — Other Ambulatory Visit

## 2024-02-05 NOTE — Progress Notes (Signed)
 Chief Complaint: Patient was seen in consultation today for right knee pain.   Referring Physician(s): Graves,John  History of Present Illness: Autumn Paul is a 78 y.o. female with a medical history significant for glaucoma, fibromyalgia, osteoporosis, spinal stenosis with neurogenic claudication and osteoarthritis with bilateral knee pain. Her right knee bothers her the most and is painful with both activity and rest. Knee injections have been ineffective at bringing her pain to a manageable level. She also participated in several months of physical therapy.   The patient has now been referred to Interventional Radiology to explore geniculate artery embolization as a potential treatment option.  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 Authorizing Provider  alendronate (FOSAMAX) 70 MG tablet Take 70 mg by mouth once a week. Take with a full glass of water on an empty stomach.    [provider]  Ascorbic Acid (VITAMIN C PO) Take by mouth.    [provider]  celecoxib (CELEBREX) 200 MG capsule Take 200 mg by mouth 2 (two) times daily.    [provider]  diclofenac Sodium (VOLTAREN) 1 % GEL Apply topically. 07/29/19   [provider]  docusate sodium  (COLACE) 100 MG capsule Take 1 capsule (100 mg total) by mouth every 12 (twelve) hours. 07/20/23   Randol Simmonds, MD  dorzolamide-timolol (COSOPT) 22.3-6.8 MG/ML ophthalmic solution 1  drop 2 (two) times daily. 09/12/19   [provider]  Ergocalciferol (VITAMIN D2 PO) Take 1.25 mg by mouth once a week.    [provider]  esomeprazole (NEXIUM) 20 MG capsule Take 20 mg by mouth daily at 12 noon.    [provider]  ezetimibe (ZETIA) 10 MG tablet Take 10 mg by mouth daily. 10/01/19   [provider]  FLUTICASONE PROPIONATE, NASAL, NA Place into the nose at bedtime.    [provider]  latanoprost (XALATAN) 0.005 % ophthalmic solution 1 drop at bedtime. 05/18/19   [provider]  nitrofurantoin, macrocrystal-monohydrate, (MACROBID) 100 MG capsule Take 100 mg by mouth every 12 (twelve) hours. Patient not taking: Reported on 07/17/2020 10/26/19   [provider]  oxyCODONE -acetaminophen  (PERCOCET/ROXICET) 5-325 MG tablet Take 1 tablet by mouth every 6 (six) hours as needed for severe pain (pain score 7-10). 07/20/23   Randol Simmonds, MD  ramelteon (ROZEREM) 8 MG tablet Take 8 mg by mouth at bedtime.    [provider]  vitamin B-12 (CYANOCOBALAMIN) 500 MCG tablet Take 500 mcg by mouth daily.    [provider]     Family History  Problem Relation Age of Onset   Colon cancer Neg Hx    Esophageal cancer Neg Hx    Rectal cancer Neg Hx    Stomach cancer Neg Hx     Social History   Socioeconomic History   Marital status: Married  Spouse name: Not on file   Number of children: Not on file   Years of education: Not on file   Highest education level: Not on file  Occupational History   Not on file  Tobacco Use   Smoking status: Former   Smokeless tobacco: Never  Substance and Sexual Activity   Alcohol use: Yes    Alcohol/week: 0.0 standard drinks of alcohol    Comment: once monthly or less   Drug use: No   Sexual activity: Not on file  Other Topics Concern   Not on file  Social History Narrative   Not on file   Social Drivers of Health   Financial Resource Strain: Not on file  Food  Insecurity: Not on file  Transportation Needs: Not on file  Physical Activity: Not on file  Stress: Not on file  Social Connections: Unknown (07/31/2021)   [facility]   Social Network    Social Network: Not on file    Review of Systems: A 12 point ROS discussed and pertinent positives are indicated in the HPI above.  All other systems are negative.  Review of Systems  Vital Signs: There were no vitals taken for this visit.  Advance Care Plan: The advanced care plan/surrogate decision maker was discussed at the time of visit and documented in the medical record.    Physical Exam  Imaging: No results found.  Labs:  CBC: No results for input(s): WBC, HGB, HCT, PLT in the last 8760 hours.  COAGS: No results for input(s): INR, APTT in the last 8760 hours.  BMP: No results for input(s): NA, K, CL, CO2, GLUCOSE, BUN, CALCIUM, CREATININE, GFRNONAA, GFRAA in the last 8760 hours.  Invalid input(s): CMP  LIVER FUNCTION TESTS: No results for input(s): BILITOT, AST, ALT, ALKPHOS, PROT, ALBUMIN in the last 8760 hours.  TUMOR MARKERS: No results for input(s): AFPTM, CEA, CA199, CHROMGRNA in the last 8760 hours.  Assessment and Plan:  78 year old female with a history of osteoarthritis with right knee pain.   Thank you for this interesting consult.  I greatly enjoyed meeting Autumn Paul and look forward to participating in their care.  A copy of this report was sent to the requesting provider on this date.  Ester Sides, MD Pager: [phone]    I spent a total of  30 Minutes   in face to face in clinical consultation, greater than 50% of which was counseling/coordinating care for right knee pain.

## 2024-02-09 ENCOUNTER — Ambulatory Visit
Admission: RE | Admit: 2024-02-09 | Discharge: 2024-02-09 | Disposition: A | Discharge status: Home / Self Care | Source: Ambulatory Visit | Attending: Interventional Radiology | Admitting: Interventional Radiology

## 2024-02-09 ENCOUNTER — Ambulatory Visit
Admission: RE | Admit: 2024-02-09 | Discharge: 2024-02-09 | Disposition: A | Discharge status: Home / Self Care | Source: Ambulatory Visit | Attending: Orthopedic Surgery | Admitting: Orthopedic Surgery

## 2024-02-09 DIAGNOSIS — M1711 Unilateral primary osteoarthritis, right knee: Secondary | ICD-10-CM

## 2024-02-09 HISTORY — PX: IR RADIOLOGIST EVAL & MGMT: IMG5224

## 2024-02-11 ENCOUNTER — Emergency Department (HOSPITAL_BASED_OUTPATIENT_CLINIC_OR_DEPARTMENT_OTHER)

## 2024-02-11 ENCOUNTER — Encounter (HOSPITAL_BASED_OUTPATIENT_CLINIC_OR_DEPARTMENT_OTHER): Payer: Self-pay

## 2024-02-11 ENCOUNTER — Emergency Department (HOSPITAL_BASED_OUTPATIENT_CLINIC_OR_DEPARTMENT_OTHER)
Admission: EM | Admit: 2024-02-11 | Discharge: 2024-02-11 | Disposition: A | Discharge status: Home / Self Care | Attending: Emergency Medicine | Admitting: Emergency Medicine

## 2024-02-11 ENCOUNTER — Other Ambulatory Visit: Payer: Self-pay

## 2024-02-11 DIAGNOSIS — N200 Calculus of kidney: Secondary | ICD-10-CM

## 2024-02-11 DIAGNOSIS — N132 Hydronephrosis with renal and ureteral calculous obstruction: Secondary | ICD-10-CM | POA: Diagnosis not present

## 2024-02-11 DIAGNOSIS — R109 Unspecified abdominal pain: Secondary | ICD-10-CM | POA: Diagnosis present

## 2024-02-11 LAB — COMPREHENSIVE METABOLIC PANEL WITH GFR
ALT: 14 U/L (ref 0–44)
AST: 22 U/L (ref 15–41)
Albumin: 4 g/dL (ref 3.5–5.0)
Alkaline Phosphatase: 60 U/L (ref 38–126)
Anion gap: 11 (ref 5–15)
BUN: 22 mg/dL (ref 8–23)
CO2: 25 mmol/L (ref 22–32)
Calcium: 9.9 mg/dL (ref 8.9–10.3)
Chloride: 103 mmol/L (ref 98–111)
Creatinine, Ser: 0.96 mg/dL (ref 0.44–1.00)
GFR, Estimated: 60 mL/min — ABNORMAL LOW (ref >60)
Glucose, Bld: 131 mg/dL — ABNORMAL HIGH (ref 70–99)
Potassium: 3.8 mmol/L (ref 3.5–5.1)
Sodium: 139 mmol/L (ref 135–145)
Total Bilirubin: 0.6 mg/dL (ref 0.0–1.2)
Total Protein: 7 g/dL (ref 6.5–8.1)

## 2024-02-11 LAB — URINALYSIS, W/ REFLEX TO CULTURE (INFECTION SUSPECTED)
Bilirubin Urine: NEGATIVE
Glucose, UA: NEGATIVE mg/dL
Nitrite: NEGATIVE
Protein, ur: 30 mg/dL — AB
RBC / HPF: >50 RBC/hpf (ref 0–5)
Specific Gravity, Urine: 1.026 (ref 1.005–1.030)
pH: 6.5 (ref 5.0–8.0)

## 2024-02-11 LAB — CBC WITH DIFFERENTIAL/PLATELET
Abs Immature Granulocytes: 0.02 K/uL (ref 0.00–0.07)
Basophils Absolute: 0 K/uL (ref 0.0–0.1)
Basophils Relative: 0 %
Eosinophils Absolute: 0.1 K/uL (ref 0.0–0.5)
Eosinophils Relative: 2 %
HCT: 44.3 % (ref 36.0–46.0)
Hemoglobin: 14.5 g/dL (ref 12.0–15.0)
Immature Granulocytes: 0 %
Lymphocytes Relative: 22 %
Lymphs Abs: 1.3 K/uL (ref 0.7–4.0)
MCH: 31.3 pg (ref 26.0–34.0)
MCHC: 32.7 g/dL (ref 30.0–36.0)
MCV: 95.5 fL (ref 80.0–100.0)
Monocytes Absolute: 0.5 K/uL (ref 0.1–1.0)
Monocytes Relative: 8 %
Neutro Abs: 4.1 K/uL (ref 1.7–7.7)
Neutrophils Relative %: 68 %
Platelets: 172 K/uL (ref 150–400)
RBC: 4.64 MIL/uL (ref 3.87–5.11)
RDW: 13.4 % (ref 11.5–15.5)
WBC: 6 K/uL (ref 4.0–10.5)
nRBC: 0 % (ref 0.0–0.2)

## 2024-02-11 LAB — LIPASE, BLOOD: Lipase: 24 U/L (ref 11–51)

## 2024-02-11 MED ORDER — FENTANYL CITRATE (PF) 50 MCG/ML IJ SOSY
50.0000 ug | PREFILLED_SYRINGE | Freq: Once | INTRAMUSCULAR | Status: AC
  Administered 2024-02-11: 50 ug via INTRAVENOUS
  Filled 2024-02-11: qty 1

## 2024-02-11 MED ORDER — TAMSULOSIN HCL 0.4 MG PO CAPS: 0.4000 mg | ORAL_CAPSULE | Freq: Every day | ORAL | 0 refills | Status: AC

## 2024-02-11 MED ORDER — ONDANSETRON HCL 4 MG/2ML IJ SOLN
4.0000 mg | Freq: Once | INTRAMUSCULAR | Status: AC
  Administered 2024-02-11: 4 mg via INTRAVENOUS
  Filled 2024-02-11: qty 2

## 2024-02-11 MED ORDER — HYDROCODONE-ACETAMINOPHEN 5-325 MG PO TABS: 1.0000 | ORAL_TABLET | ORAL | 0 refills | Status: AC | PRN

## 2024-02-11 MED ORDER — SODIUM CHLORIDE 0.9 % IV BOLUS
500.0000 mL | Freq: Once | INTRAVENOUS | Status: AC
  Administered 2024-02-11: 500 mL via INTRAVENOUS

## 2024-02-11 MED ORDER — ONDANSETRON 4 MG PO TBDP: 4.0000 mg | ORAL_TABLET | Freq: Three times a day (TID) | ORAL | 0 refills | Status: AC | PRN

## 2024-02-11 NOTE — ED Provider Notes (Signed)
 [place] EMERGENCY DEPARTMENT AT [facility] Provider Note   CSN: [id number] Arrival date & time: 02/11/24  1344     Patient presents with: Abdominal Pain and Back Pain   Autumn Paul is a 78 y.o. female.   Patient with history of osteoporosis, HLD, glaucoma, fibromyalgia presents for evaluation of sudden onset right flank pain yesterday, associated with nausea. The symptoms resolved over a brief period but returned this morning and radiated to the RUQ/right mid-abdomen, also associated with nausea that progressed to vomiting. No fever, cough, chest pain. No diarrhea or constipation. No previous abdominal surgeries. No history of kidney stones.   The history is provided by the patient. No language interpreter was used.  Abdominal Pain Back Pain Associated symptoms: abdominal pain        Prior to Admission medications   Medication Sig Start Date End Date Taking? Authorizing Provider  HYDROcodone -acetaminophen  (NORCO/VICODIN) 5-325 MG tablet Take 1 tablet by mouth every 4 (four) hours as needed. 02/11/24  Yes Robertta Halfhill, Margit, PA-C  ondansetron  (ZOFRAN -ODT) 4 MG disintegrating tablet Take 1 tablet (4 mg total) by mouth every 8 (eight) hours as needed for nausea or vomiting. 02/11/24  Yes Odell Margit, PA-C  tamsulosin  (FLOMAX ) 0.4 MG CAPS capsule Take 1 capsule (0.4 mg total) by mouth daily after breakfast. 02/11/24  Yes Ron Beske, PA-C  alendronate (FOSAMAX) 70 MG tablet Take 70 mg by mouth once a week. Take with a full glass of water on an empty stomach.    [provider]  Ascorbic Acid (VITAMIN C PO) Take by mouth.    [provider]  celecoxib (CELEBREX) 200 MG capsule Take 200 mg by mouth 2 (two) times daily.    [provider]  diclofenac Sodium (VOLTAREN) 1 % GEL Apply topically. 07/29/19   [provider]  docusate sodium  (COLACE) 100 MG capsule Take 1 capsule (100 mg total) by mouth every 12 (twelve) hours. 07/20/23    Randol Simmonds, MD  dorzolamide-timolol (COSOPT) 22.3-6.8 MG/ML ophthalmic solution 1 drop 2 (two) times daily. 09/12/19   [provider]  Ergocalciferol (VITAMIN D2 PO) Take 1.25 mg by mouth once a week.    [provider]  esomeprazole (NEXIUM) 20 MG capsule Take 20 mg by mouth daily at 12 noon.    [provider]  ezetimibe (ZETIA) 10 MG tablet Take 10 mg by mouth daily. 10/01/19   [provider]  FLUTICASONE PROPIONATE, NASAL, NA Place into the nose at bedtime.    [provider]  latanoprost (XALATAN) 0.005 % ophthalmic solution 1 drop at bedtime. 05/18/19   [provider]  nitrofurantoin, macrocrystal-monohydrate, (MACROBID) 100 MG capsule Take 100 mg by mouth every 12 (twelve) hours. Patient not taking: Reported on 07/17/2020 10/26/19   [provider]  oxyCODONE -acetaminophen  (PERCOCET/ROXICET) 5-325 MG tablet Take 1 tablet by mouth every 6 (six) hours as needed for severe pain (pain score 7-10). 07/20/23   Randol Simmonds, MD  ramelteon (ROZEREM) 8 MG tablet Take 8 mg by mouth at bedtime.    [provider]  vitamin B-12 (CYANOCOBALAMIN) 500 MCG tablet Take 500 mcg by mouth daily.    [provider]    Allergies: Aleve [naproxen sodium] and Sulfa antibiotics    Review of Systems  Gastrointestinal:  Positive for abdominal pain.  Musculoskeletal:  Positive for back pain.    Updated Vital Signs BP 136/70 (BP Location: Right Arm)   Pulse 60   Temp 98 F (36.7 C) (Oral)  Resp 16   SpO2 99%   Physical Exam Vitals and nursing note reviewed.  Constitutional:      Appearance: She is well-developed. She is not diaphoretic.  HENT:     Head: Normocephalic.  Cardiovascular:     Rate and Rhythm: Normal rate and regular rhythm.     Heart sounds: No murmur heard. Pulmonary:     Effort: Pulmonary effort is normal. No respiratory distress.     Breath sounds: No wheezing, rhonchi or rales.  Abdominal:     General:  There is no distension.     Palpations: Abdomen is soft.     Tenderness: There is abdominal tenderness in the right upper quadrant. There is right CVA tenderness. There is no guarding.  Skin:    General: Skin is warm and dry.  Neurological:     Mental Status: She is alert.     (all labs ordered are listed, but only abnormal results are displayed) Labs Reviewed  COMPREHENSIVE METABOLIC PANEL WITH GFR - Abnormal; Notable for the following components:      Result Value   Glucose, Bld 131 (*)    GFR, Estimated 60 (*)    All other components within normal limits  URINALYSIS, W/ REFLEX TO CULTURE (INFECTION SUSPECTED) - Abnormal; Notable for the following components:   APPearance HAZY (*)    Hgb urine dipstick LARGE (*)    Ketones, ur TRACE (*)    Protein, ur 30 (*)    Leukocytes,Ua MODERATE (*)    Bacteria, UA RARE (*)    All other components within normal limits  CBC WITH DIFFERENTIAL/PLATELET  LIPASE, BLOOD    EKG: None  Radiology: CT Renal Stone Study Result Date: 02/11/2024 EXAM: CT UROGRAM 02/11/2024 06:00:00 PM TECHNIQUE: CT of the abdomen and pelvis was performed before and after the administration of intravenous contrast as per CT urogram protocol. Multiplanar reformatted images as well as MIP urogram images are provided for review. Automated exposure control, iterative reconstruction, and/or weight based adjustment of the mA/kV was utilized to reduce the radiation dose to as low as reasonably achievable. COMPARISON: None available. CLINICAL HISTORY: Abdominal/flank pain, stone suspected. FINDINGS: LOWER CHEST: Small hiatal hernia. LIVER: The liver contains scattered cysts, the largest of which centrally measuring 1.5 cm. GALLBLADDER AND BILE DUCTS: Gallbladder is unremarkable. No biliary ductal dilatation. SPLEEN: No acute abnormality. PANCREAS: No acute abnormality. ADRENAL GLANDS: No acute abnormality. KIDNEYS, URETERS AND BLADDER: 6 mm mid right ureteral stone with mild  right hydronephrosis. 9 mm nonobstructing stone in the upper pole of the right kidney. No stones or hydronephrosis on the left. No perinephric or periureteral stranding. Urinary bladder is unremarkable. GI AND BOWEL: Stomach demonstrates no acute abnormality. There is no bowel obstruction. Normal appendix. PERITONEUM AND RETROPERITONEUM: No ascites. No free air. VASCULATURE: Aorta is normal in caliber. LYMPH NODES: No lymphadenopathy. REPRODUCTIVE ORGANS: No acute abnormality. 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 No acute osseous abnormality. No focal soft tissue abnormality. IMPRESSION: 1. 6 mm mid right ureteral stone with mild right hydronephrosis. 2. Right upper pole nephrolithiasis. Electronically signed by: Franky Crease MD 02/11/2024 07:05 PM EST RP Workstation: [id number]   US  Abdomen Limited RUQ (LIVER/GB) Result Date: 02/11/2024 CLINICAL DATA:  151470 RUQ abdominal pain [id number] EXAM: ULTRASOUND ABDOMEN LIMITED RIGHT UPPER QUADRANT COMPARISON:  01/01/2022, 12/13/2021 FINDINGS: Gallbladder: No gallstones. No wall thickening or pericholecystic fluid. No sonographic Murphy's sign noted by sonographer. Common bile duct: Diameter: 4.5 mm Liver: Normal  echogenicity. Right hepatic lobe cyst measuring 1.7 cm. No intrahepatic biliary ductal dilation. Portal vein is patent on color Doppler imaging with normal direction of blood flow towards the liver. Right Kidney: Right upper pole calculus measuring 1.4 cm. Dilation of the upper pole calices present. Other: None. IMPRESSION: 1. No cholecystolithiasis or changes of acute cholecystitis. 2. Calyceal calculus in the upper pole of the right kidney, measuring 1.4 cm. There is dilation of the upper pole calices, suggesting underlying hydronephrosis. A noncontrast CT of the abdomen and pelvis [place] be considered to evaluate for obstructive uropathy. Electronically Signed   By: Rogelia Myers M.D.   On: 02/11/2024 16:50      Procedures   Medications Ordered in the ED  fentaNYL  (SUBLIMAZE ) injection 50 mcg (has no administration in time range)  sodium chloride  0.9 % bolus 500 mL (0 mLs Intravenous Stopped 02/11/24 1557)  fentaNYL  (SUBLIMAZE ) injection 50 mcg (50 mcg Intravenous Given 02/11/24 1453)  ondansetron  (ZOFRAN ) injection 4 mg (4 mg Intravenous Given 02/11/24 1453)  fentaNYL  (SUBLIMAZE ) injection 50 mcg (50 mcg Intravenous Given 02/11/24 1745)  ondansetron  (ZOFRAN ) injection 4 mg (4 mg Intravenous Given 02/11/24 1753)    Clinical Course as of 02/11/24 1939  Wed Feb 11, 2024  1431 Patient with symptoms of right flank pain and nausea yesterday, recurrent right flank pain radiating to RUQ/right mid-abdomen today with vomiting. Well appearing, non-toxic. Ddx: cholecystitis, kidney stone, obstruction, colitis, PUD, pyelonephritis/UTI. [SU]  1659 RUQ US  negative. There is microscopic hematuria. Will obtain CT renal. Patient updated. CT ordered. Pain and nausea remain controlled.  [SU]  1927 CT renal showing 6 mm stone at mid-ureter with moderate hydronephrosis. Recheck of the patient: pain is managed. Updated patient and family regarding diagnosis and plan for discharge. Will provide Toradol, final dose pain medication. Discussed return precautions. Will refer to urology. [SU]    Clinical Course User Index [SU] Odell Balls, PA-C                                 Medical Decision Making Amount and/or Complexity of Data Reviewed Labs: ordered. Radiology: ordered.  Risk Prescription drug management.        Final diagnoses:  Right kidney stone    ED Discharge Orders          Ordered    HYDROcodone -acetaminophen  (NORCO/VICODIN) 5-325 MG tablet  Every 4 hours PRN        02/11/24 1933    ondansetron  (ZOFRAN -ODT) 4 MG disintegrating tablet  Every 8 hours PRN        02/11/24 1933    tamsulosin  (FLOMAX ) 0.4 MG CAPS capsule  Daily after breakfast        02/11/24 1939                Odell Balls, PA-C 02/11/24 1939    Bernard Drivers, MD 02/17/24 1427

## 2024-02-11 NOTE — ED Triage Notes (Signed)
 Pt c/o R lower back pain (7-8 dull ache) that radiates into abdomen. Advises first episode Thursday, states husband had procedure & I got a pain that started in my R lower back. I got water & laid down & pain subsided. States that same symptoms onset approx 1hr ago, symptoms have since subsided.

## 2024-02-11 NOTE — Discharge Instructions (Addendum)
 As we discussed, your kidney stone is 6 mm and about 1/2 way between the kidney and the bladder. Take Norco for pain and Zofran  for nausea as needed. Take Flomax  daily.   Follow up with urology if pain is no better in 3-4 days. It is important to return to the ED if you develop a fever, or have uncontrolled pain or vomiting.

## 2024-03-30 ENCOUNTER — Other Ambulatory Visit: Payer: Self-pay | Admitting: Interventional Radiology

## 2024-03-30 DIAGNOSIS — M1711 Unilateral primary osteoarthritis, right knee: Secondary | ICD-10-CM
